# Patient Record
Sex: Male | Born: 1955 | Race: White | Hispanic: No | Marital: Single | Smoking: Never smoker
Health system: Southern US, Community
[De-identification: ages and names within clinical notes are randomized; demographics above are authoritative.]

## PROBLEM LIST (undated history)

## (undated) DIAGNOSIS — G8929 Other chronic pain: Secondary | ICD-10-CM

## (undated) DIAGNOSIS — M549 Dorsalgia, unspecified: Secondary | ICD-10-CM

## (undated) DIAGNOSIS — M719 Bursopathy, unspecified: Secondary | ICD-10-CM

## (undated) DIAGNOSIS — F419 Anxiety disorder, unspecified: Secondary | ICD-10-CM

## (undated) DIAGNOSIS — G20 Parkinson's disease: Secondary | ICD-10-CM

## (undated) DIAGNOSIS — E785 Hyperlipidemia, unspecified: Secondary | ICD-10-CM

## (undated) DIAGNOSIS — G20A1 Parkinson's disease without dyskinesia, without mention of fluctuations: Secondary | ICD-10-CM

## (undated) HISTORY — PX: APPENDECTOMY: SHX54

## (undated) HISTORY — PX: HERNIA REPAIR: SHX51

## (undated) HISTORY — DX: Hyperlipidemia, unspecified: E78.5

## (undated) HISTORY — DX: Bursopathy, unspecified: M71.9

## (undated) HISTORY — DX: Anxiety disorder, unspecified: F41.9

## (undated) HISTORY — PX: OTHER SURGICAL HISTORY: SHX169

## (undated) HISTORY — DX: Dorsalgia, unspecified: M54.9

## (undated) HISTORY — DX: Other chronic pain: G89.29

---

## 2014-08-20 ENCOUNTER — Other Ambulatory Visit: Payer: Self-pay | Admitting: Orthopedic Surgery

## 2014-08-20 DIAGNOSIS — M2042 Other hammer toe(s) (acquired), left foot: Secondary | ICD-10-CM

## 2014-08-27 ENCOUNTER — Other Ambulatory Visit: Payer: Self-pay | Admitting: Orthopedic Surgery

## 2014-08-27 ENCOUNTER — Ambulatory Visit
Admission: RE | Admit: 2014-08-27 | Discharge: 2014-08-27 | Disposition: A | Discharge status: Home / Self Care | Payer: 59 | Source: Ambulatory Visit | Attending: Orthopedic Surgery | Admitting: Orthopedic Surgery

## 2014-08-27 DIAGNOSIS — M25572 Pain in left ankle and joints of left foot: Secondary | ICD-10-CM

## 2014-08-27 DIAGNOSIS — M2042 Other hammer toe(s) (acquired), left foot: Secondary | ICD-10-CM

## 2014-11-11 DIAGNOSIS — M19172 Post-traumatic osteoarthritis, left ankle and foot: Secondary | ICD-10-CM | POA: Insufficient documentation

## 2014-12-07 DIAGNOSIS — K219 Gastro-esophageal reflux disease without esophagitis: Secondary | ICD-10-CM | POA: Insufficient documentation

## 2015-01-30 DIAGNOSIS — Z96662 Presence of left artificial ankle joint: Secondary | ICD-10-CM | POA: Insufficient documentation

## 2016-09-14 DIAGNOSIS — M7552 Bursitis of left shoulder: Secondary | ICD-10-CM | POA: Diagnosis not present

## 2016-09-14 DIAGNOSIS — G252 Other specified forms of tremor: Secondary | ICD-10-CM | POA: Diagnosis not present

## 2016-09-29 ENCOUNTER — Encounter: Payer: Self-pay | Admitting: *Deleted

## 2016-09-30 ENCOUNTER — Encounter: Payer: Self-pay | Admitting: Diagnostic Neuroimaging

## 2016-09-30 ENCOUNTER — Encounter (INDEPENDENT_AMBULATORY_CARE_PROVIDER_SITE_OTHER): Payer: Self-pay

## 2016-09-30 ENCOUNTER — Ambulatory Visit (INDEPENDENT_AMBULATORY_CARE_PROVIDER_SITE_OTHER): Payer: 59 | Admitting: Diagnostic Neuroimaging

## 2016-09-30 VITALS — BP 140/91 | HR 71 | Ht 70.0 in | Wt 237.4 lb

## 2016-09-30 DIAGNOSIS — R259 Unspecified abnormal involuntary movements: Secondary | ICD-10-CM

## 2016-09-30 DIAGNOSIS — G252 Other specified forms of tremor: Secondary | ICD-10-CM

## 2016-09-30 DIAGNOSIS — R430 Anosmia: Secondary | ICD-10-CM

## 2016-09-30 NOTE — Progress Notes (Signed)
[facility]  PATIENT: John Madden DOB: April 22, 1956  REFERRING CLINICIAN: Nyra Capes HISTORY FROM: patient  REASON FOR VISIT: new consult    HISTORICAL  CHIEF COMPLAINT:  Chief Complaint  Patient presents with  . NP Nyra Capes  . Fine Tremors    fine tremors R hand and arm (some in L hand).  L shoulder pain, R leg sciatica pain    HISTORY OF PRESENT ILLNESS:   61 year old male here for evaluation of tremor in right upper extremity. Also with left shoulder pain and right sciatic nerve pain and numbness.  Patient reports at least 2-3 months of tremor while using his utensils or holding a cup. Sometimes his arm shakes without any action. Patient also has had one year of left shoulder pain treated conservatively. He has also had decreased sense of smell and taste in the last 1 year.  Patient denies any gait or balance difficulty. He has some joint swelling and pain. He has had some weight gain related to medications. Also with some blurred vision.  No other specific triggering or alleviating factors.  Patient reports history of "TIA" symptoms 2017, with workup which apparently was negative.    REVIEW OF SYSTEMS: Full 14 system review of systems performed and negative with exception of: Only as per history of present illness.  ALLERGIES: No Known Allergies  HOME MEDICATIONS: Outpatient Medications Prior to Visit  Medication Sig Dispense Refill  . aspirin EC 81 MG tablet Take 81 mg by mouth daily.    . Naproxen-Esomeprazole (VIMOVO PO) Take by mouth. One tab po bid     No facility-administered medications prior to visit.     PAST MEDICAL HISTORY: Past Medical History:  Diagnosis Date  . Anxiety   . Back pain, chronic   . Bursitis    left shoulder  . Hyperlipidemia     PAST SURGICAL HISTORY: Past Surgical History:  Procedure Laterality Date  . ankle surgery     left ankle  . APPENDECTOMY    . HERNIA REPAIR      FAMILY HISTORY: Family  History  Problem Relation Age of Onset  . Lymphoma Mother     2016 remission  . Lung cancer Father     SOCIAL HISTORY:  Social History   Social History  . Marital status: Single    Spouse name: N/A  . Number of children: N/A  . Years of education: N/A   Occupational History  . Not on file.   Social History Main Topics  . Smoking status: Never Smoker  . Smokeless tobacco: Never Used  . Alcohol use Yes     Comment: moderate  . Drug use: No  . Sexual activity: Not on file   Other Topics Concern  . Not on file   Social History Narrative   Lives home alone.  One grown child lives with mother in [place].  Works at [employer].  HS grad.  Caffeine one cup daily.      PHYSICAL EXAM  GENERAL EXAM/CONSTITUTIONAL: Vitals:  Vitals:   09/30/16 0812  BP: (!) 140/91  Pulse: 71  Weight: 237 lb 6.4 oz (107.7 kg)  Height: 5\' 10"  (1.778 m)     Body mass index is 34.06 kg/m.  Visual Acuity Screening   Right eye Left eye Both eyes  Without correction: 20/40 20/30   With correction:        Patient is in no distress; well developed, nourished and groomed; neck is supple  CARDIOVASCULAR:  Examination of carotid  arteries is normal; no carotid bruits  Regular rate and rhythm, no murmurs  Examination of peripheral vascular system by observation and palpation is normal  EYES:  Ophthalmoscopic exam of optic discs and posterior segments is normal; no papilledema or hemorrhages  MUSCULOSKELETAL:  Gait, strength, tone, movements noted in Neurologic exam below  NEUROLOGIC: MENTAL STATUS:  No flowsheet data found.  awake, alert, oriented to person, place and time  recent and remote memory intact  normal attention and concentration  language fluent, comprehension intact, naming intact  fund of knowledge appropriate  CRANIAL NERVE:   2nd - no papilledema on fundoscopic exam  2nd, 3rd, 4th, 6th - pupils equal and reactive to light, visual fields full to  confrontation, extraocular muscles intact, no nystagmus  5th - facial sensation symmetric  7th - facial strength symmetric  8th - hearing intact  9th - palate elevates symmetrically, uvula midline  11th - shoulder shrug symmetric  12th - tongue protrusion midline  SOFT VOICE; SLIGHTLY HOARSE  MOTOR:   SLIGHT INCR TONE IN LEFT UPPER EXT (COGWHEELING)  SLIGHT RESTING TREMOR IN RUE  MINOR POSTURAL TREMOR (RUE > LUE)  normal bulk; full strength in the BUE, BLE  NO BRADYKINESIA  SENSORY:   normal and symmetric to light touch, temperature, vibration  EXCEPT DECR SENS IN LEFT FOOT TO VIB  COORDINATION:   finger-nose-finger, fine finger movements normal  ABLE TO DRAW ARCHIMEDES SPIRAL NORMALLY  HANDWRITING SAMPLE NORMAL  REFLEXES:   deep tendon reflexes TRACE and symmetric  GAIT/STATION:   narrow based gait; DECR RIGHT ARM SWING; SUBTLE TREMOR IN RIGHT HAND WITH WALKING; romberg is negative    DIAGNOSTIC DATA (LABS, IMAGING, TESTING) - I reviewed patient records, labs, notes, testing and imaging myself where available.  No results found for: WBC, HGB, HCT, MCV, PLT No results found for: NA, K, CL, CO2, GLUCOSE, BUN, CREATININE, CALCIUM, PROT, ALBUMIN, AST, ALT, ALKPHOS, BILITOT, GFRNONAA, GFRAA No results found for: CHOL, HDL, LDLCALC, LDLDIRECT, TRIG, CHOLHDL No results found for: HGBA1C No results found for: VITAMINB12 No results found for: TSH       ASSESSMENT AND PLAN  61 y.o. year old male here with gradual onset progressive resting tremor, bradykinesia, cogwheel rigidity, decreased smell, consistent with parkinsonism. Will proceed with further workup.   Ddx tremor --> parkinsonism (parkinson's disease vs vascular parkinsonism) vs essential tremor vs enhanced physiologic tremor  1. Resting tremor   2. Postural tremor   3. Loss of smell      PLAN: - check MRI brain and lab testing; then may check DATscan - improve nutrition, exercise and  sleep - consider starting multivitamin +/- coenzyme Q10 - may consider carbidopa/levodopa at next visit  Orders Placed This Encounter  Procedures  . MR BRAIN WO CONTRAST  . TSH  . Vitamin B12   Return in about 3 months (around 12/30/2016).    Penni Bombard, MD 6/76/1950, 9:32 AM Certified in Neurology, Neurophysiology and Neuroimaging  [facility] [address], [place], [place] [address] [phone]

## 2016-09-30 NOTE — Patient Instructions (Signed)
Thank you for coming to see [place] at [facility]. I hope we have been able to provide you high quality care today.  You may receive a patient satisfaction survey over the next few weeks. We would appreciate your feedback and comments so that we may continue to improve ourselves and the health of our patients.   - check MRI brain and lab testing; then may check DATscan  - improve nutrition, exercise and sleep  - consider starting multivitamin +/- coenzyme Q10   ~~~~~~~~~~~~~~~~~~~~~~~~~~~~~~~~~~~~~~~~~~~~~~~~~~~~~~~~~~~~~~~~~  DR. PENUMALLI'S GUIDE TO HAPPY AND HEALTHY LIVING These are some of my general health and wellness recommendations. Some of them may apply to you better than others. Please use common sense as you try these suggestions and feel free to ask me any questions.   ACTIVITY/FITNESS Mental, social, emotional and physical stimulation are very important for brain and body health. Try learning a new activity (arts, music, language, sports, games).  Keep moving your body to the best of your abilities. You can do this at home, inside or outside, the park, community center, gym or anywhere you like. Consider a physical therapist or personal trainer to get started. Consider the app Sworkit. Fitness trackers such as smart-watches, smart-phones or Fitbits can help as well.   NUTRITION Eat more plants: colorful vegetables, nuts, seeds and berries.  Eat less sugar, salt, preservatives and processed foods.  Avoid toxins such as cigarettes and alcohol.  Drink water when you are thirsty. Warm water with a slice of lemon is an excellent morning drink to start the day.  Consider these websites for more information The Nutrition Source (https://www.henry-hernandez.biz/) Precision Nutrition (WindowBlog.ch)   RELAXATION Consider practicing mindfulness meditation or other relaxation techniques such as deep breathing, prayer,  yoga, tai chi, massage. See website mindful.org or the apps Headspace or Calm to help get started.   SLEEP Try to get at least 7-8+ hours sleep per day. Regular exercise and reduced caffeine will help you sleep better. Practice good sleep hygeine techniques. See website sleep.org for more information.   PLANNING Prepare estate planning, living will, healthcare POA documents. Sometimes this is best planned with the help of an attorney. Theconversationproject.org and agingwithdignity.org are excellent resources.

## 2016-10-01 ENCOUNTER — Telehealth: Payer: Self-pay | Admitting: *Deleted

## 2016-10-01 LAB — TSH: TSH: 1.03 u[IU]/mL (ref 0.450–4.500)

## 2016-10-01 LAB — VITAMIN B12: Vitamin B-12: 527 pg/mL (ref 232–1245)

## 2016-10-01 NOTE — Telephone Encounter (Signed)
Spoke to pt and relayed that lab results were normal.  He verbalized understanding.

## 2016-10-01 NOTE — Telephone Encounter (Signed)
-----   Message from Penni Bombard, MD sent at 10/01/2016  1:51 PM EDT ----- Unremarkable labs. Continue current plan. Please call patient. -VRP

## 2016-10-21 ENCOUNTER — Ambulatory Visit (INDEPENDENT_AMBULATORY_CARE_PROVIDER_SITE_OTHER): Payer: 59

## 2016-10-21 DIAGNOSIS — R430 Anosmia: Secondary | ICD-10-CM | POA: Diagnosis not present

## 2016-10-21 DIAGNOSIS — G252 Other specified forms of tremor: Secondary | ICD-10-CM | POA: Diagnosis not present

## 2016-10-21 DIAGNOSIS — R259 Unspecified abnormal involuntary movements: Secondary | ICD-10-CM | POA: Diagnosis not present

## 2016-10-27 ENCOUNTER — Telehealth: Payer: Self-pay | Admitting: *Deleted

## 2016-10-27 NOTE — Telephone Encounter (Signed)
Per Dr Leta Baptist, spoke with patient and informed him his MRI brain results are unremarkable, with mild spots of scar tissue. Advised Dr Leta Baptist will continue with current treatment plan. Reviewed Dr Jearld Lesch plan per office note, reminded pt of FU in July. He has started taking MVI, Co-Q 10.  Advised he monitor his symptoms, call prior to FU with any concerns, questions. He verbalized understanding, appreciation.

## 2016-12-29 DIAGNOSIS — M19172 Post-traumatic osteoarthritis, left ankle and foot: Secondary | ICD-10-CM | POA: Diagnosis not present

## 2016-12-29 DIAGNOSIS — Z96662 Presence of left artificial ankle joint: Secondary | ICD-10-CM | POA: Diagnosis not present

## 2016-12-29 DIAGNOSIS — Z471 Aftercare following joint replacement surgery: Secondary | ICD-10-CM | POA: Diagnosis not present

## 2017-01-11 ENCOUNTER — Ambulatory Visit (INDEPENDENT_AMBULATORY_CARE_PROVIDER_SITE_OTHER): Payer: 59 | Admitting: Diagnostic Neuroimaging

## 2017-01-11 ENCOUNTER — Encounter: Payer: Self-pay | Admitting: Diagnostic Neuroimaging

## 2017-01-11 VITALS — BP 128/90 | HR 63 | Ht 70.0 in | Wt 235.8 lb

## 2017-01-11 DIAGNOSIS — G252 Other specified forms of tremor: Secondary | ICD-10-CM

## 2017-01-11 NOTE — Progress Notes (Signed)
[facility]  PATIENT: John Madden DOB: January 02, 1956  REFERRING CLINICIAN: Nyra Capes HISTORY FROM: patient  REASON FOR VISIT: follow up    HISTORICAL  CHIEF COMPLAINT:  Chief Complaint  Patient presents with  . Follow-up  . Tremors    seem to be doing better.     HISTORY OF PRESENT ILLNESS:   UPDATE 01/11/17: Since last visit, now on co-Q10, exercising more, and feeling better. Has cut out coffee as well. Tremor is improved. Balance and walking better. Smell is slightly better.   PRIOR HPI (09/30/16): 61 year old male here for evaluation of tremor in right upper extremity. Also with left shoulder pain and right sciatic nerve pain and numbness. Patient reports at least 2-3 months of tremor while using his utensils or holding a cup. Sometimes his arm shakes without any action. Patient also has had one year of left shoulder pain treated conservatively. He has also had decreased sense of smell and taste in the last 1 year. Patient denies any gait or balance difficulty. He has some joint swelling and pain. He has had some weight gain related to medications. Also with some blurred vision. No other specific triggering or alleviating factors.  Patient reports history of "TIA" symptoms 2017, with workup which apparently was negative.   REVIEW OF SYSTEMS: Full 14 system review of systems performed and negative with exception of: tremors.  ALLERGIES: No Known Allergies  HOME MEDICATIONS: Outpatient Medications Prior to Visit  Medication Sig Dispense Refill  . co-enzyme Q-10 30 MG capsule Take 30 mg by mouth 3 (three) times daily.    . Multiple Vitamin (MULTIVITAMIN) tablet Take 1 tablet by mouth daily.    . Naproxen-Esomeprazole (VIMOVO) 500-20 MG TBEC Take 1 tablet by mouth 2 (two) times daily.     No facility-administered medications prior to visit.     PAST MEDICAL HISTORY: Past Medical History:  Diagnosis Date  . Anxiety   . Back pain, chronic   . Bursitis      left shoulder  . Hyperlipidemia     PAST SURGICAL HISTORY: Past Surgical History:  Procedure Laterality Date  . ankle surgery     left ankle  . APPENDECTOMY    . HERNIA REPAIR      FAMILY HISTORY: Family History  Problem Relation Age of Onset  . Lymphoma Mother        2016 remission  . Lung cancer Father     SOCIAL HISTORY:  Social History   Social History  . Marital status: Single    Spouse name: N/A  . Number of children: N/A  . Years of education: N/A   Occupational History  . Not on file.   Social History Main Topics  . Smoking status: Never Smoker  . Smokeless tobacco: Never Used  . Alcohol use Yes     Comment: moderate  . Drug use: No  . Sexual activity: Not on file   Other Topics Concern  . Not on file   Social History Narrative   Lives home alone.  One grown child lives with mother in [place].  Works at [employer].  HS grad.  Caffeine one cup daily.      PHYSICAL EXAM  GENERAL EXAM/CONSTITUTIONAL: Vitals:  Vitals:   01/11/17 1339  BP: 128/90  Pulse: 63  Weight: 235 lb 12.8 oz (107 kg)  Height: 5\' 10"  (1.778 m)   Body mass index is 33.83 kg/m. No exam data present  Patient is in no distress; well developed, nourished and  groomed; neck is supple  CARDIOVASCULAR:  Examination of carotid arteries is normal; no carotid bruits  Regular rate and rhythm, no murmurs  Examination of peripheral vascular system by observation and palpation is normal  EYES:  Ophthalmoscopic exam of optic discs and posterior segments is normal; no papilledema or hemorrhages  MUSCULOSKELETAL:  Gait, strength, tone, movements noted in Neurologic exam below  NEUROLOGIC: MENTAL STATUS:  No flowsheet data found.  awake, alert, oriented to person, place and time  recent and remote memory intact  normal attention and concentration  language fluent, comprehension intact, naming intact  fund of knowledge appropriate  CRANIAL NERVE:   2nd - no  papilledema on fundoscopic exam  2nd, 3rd, 4th, 6th - pupils equal and reactive to light, visual fields full to confrontation, extraocular muscles intact, no nystagmus  5th - facial sensation symmetric  7th - facial strength symmetric  8th - hearing intact  9th - palate elevates symmetrically, uvula midline  11th - shoulder shrug symmetric  12th - tongue protrusion midline  SOFT VOICE; SLIGHTLY HOARSE  MOTOR:   SUBTLE INCR TONE IN LEFT UPPER EXT (COGWHEELING)  NO RESTING TREMOR  MINOR POSTURAL TREMOR (RUE > LUE)  NO BRADYKINESIA IN BUE  LEFT ANKLE / FOOT TAP LIMITED FROM PRIOR INJURY  normal bulk; full strength in the BUE, BLE  SENSORY:   normal and symmetric to light touch, temperature, vibration  EXCEPT DECR SENS IN LEFT FOOT TO VIB  COORDINATION:   finger-nose-finger, fine finger movements normal  REFLEXES:   deep tendon reflexes TRACE and symmetric  GAIT/STATION:   narrow based gait; SLIGHT DECR RIGHT ARM SWING    DIAGNOSTIC DATA (LABS, IMAGING, TESTING) - I reviewed patient records, labs, notes, testing and imaging myself where available.  No results found for: WBC, HGB, HCT, MCV, PLT No results found for: NA, K, CL, CO2, GLUCOSE, BUN, CREATININE, CALCIUM, PROT, ALBUMIN, AST, ALT, ALKPHOS, BILITOT, GFRNONAA, GFRAA No results found for: CHOL, HDL, LDLCALC, LDLDIRECT, TRIG, CHOLHDL No results found for: HGBA1C Lab Results  Component Value Date   VITAMINB12 527 09/30/2016   Lab Results  Component Value Date   TSH 1.030 09/30/2016     10/21/16 MRI brain - Abnormal MRI scan of the brain showing scattered tiny periventricular and subcortical nonspecific white matter hyperintensities with the differential discussed above.     ASSESSMENT AND PLAN  61 y.o. year old male here with gradual onset (~2017) progressive resting tremor, bradykinesia, cogwheel rigidity, decreased smell, possibly consistent with parkinsonism.    Ddx tremor -->  parkinsonism (parkinson's disease vs vascular parkinsonism) vs essential tremor vs enhanced physiologic tremor  1. Postural tremor      PLAN:  - continue to optimize nutrition, exercise and sleep - continue multivitamin + coenzyme Q10 - may consider carbidopa/levodopa at next visit  Return in about 6 months (around 07/14/2017).    Penni Bombard, MD 0/27/7412, 8:78 PM Certified in Neurology, Neurophysiology and Neuroimaging  [facility] [address] [place], [place] [address] [phone]

## 2017-03-09 DIAGNOSIS — C4442 Squamous cell carcinoma of skin of scalp and neck: Secondary | ICD-10-CM | POA: Diagnosis not present

## 2017-03-09 DIAGNOSIS — L821 Other seborrheic keratosis: Secondary | ICD-10-CM | POA: Diagnosis not present

## 2017-05-26 DIAGNOSIS — K5904 Chronic idiopathic constipation: Secondary | ICD-10-CM | POA: Diagnosis not present

## 2017-05-26 DIAGNOSIS — G20 Parkinson's disease: Secondary | ICD-10-CM | POA: Diagnosis not present

## 2017-06-03 DIAGNOSIS — H2513 Age-related nuclear cataract, bilateral: Secondary | ICD-10-CM | POA: Diagnosis not present

## 2017-06-03 DIAGNOSIS — H43393 Other vitreous opacities, bilateral: Secondary | ICD-10-CM | POA: Diagnosis not present

## 2017-07-14 ENCOUNTER — Ambulatory Visit: Payer: 59 | Admitting: Diagnostic Neuroimaging

## 2017-08-07 ENCOUNTER — Encounter: Payer: Self-pay | Admitting: Diagnostic Neuroimaging

## 2017-08-11 ENCOUNTER — Ambulatory Visit: Payer: 59 | Admitting: Diagnostic Neuroimaging

## 2017-09-07 DIAGNOSIS — M25512 Pain in left shoulder: Secondary | ICD-10-CM | POA: Diagnosis not present

## 2017-09-07 DIAGNOSIS — M7522 Bicipital tendinitis, left shoulder: Secondary | ICD-10-CM | POA: Diagnosis not present

## 2017-09-07 DIAGNOSIS — M25712 Osteophyte, left shoulder: Secondary | ICD-10-CM | POA: Diagnosis not present

## 2017-09-07 DIAGNOSIS — M19012 Primary osteoarthritis, left shoulder: Secondary | ICD-10-CM | POA: Diagnosis not present

## 2017-09-07 DIAGNOSIS — M7552 Bursitis of left shoulder: Secondary | ICD-10-CM | POA: Diagnosis not present

## 2017-11-25 DIAGNOSIS — E785 Hyperlipidemia, unspecified: Secondary | ICD-10-CM | POA: Diagnosis not present

## 2017-11-25 DIAGNOSIS — L814 Other melanin hyperpigmentation: Secondary | ICD-10-CM | POA: Diagnosis not present

## 2017-11-25 DIAGNOSIS — K5904 Chronic idiopathic constipation: Secondary | ICD-10-CM | POA: Diagnosis not present

## 2017-11-25 DIAGNOSIS — L821 Other seborrheic keratosis: Secondary | ICD-10-CM | POA: Diagnosis not present

## 2017-11-25 DIAGNOSIS — G20 Parkinson's disease: Secondary | ICD-10-CM | POA: Diagnosis not present

## 2017-11-26 DIAGNOSIS — Z13220 Encounter for screening for lipoid disorders: Secondary | ICD-10-CM | POA: Diagnosis not present

## 2017-11-26 DIAGNOSIS — Z1329 Encounter for screening for other suspected endocrine disorder: Secondary | ICD-10-CM | POA: Diagnosis not present

## 2017-12-07 DIAGNOSIS — E785 Hyperlipidemia, unspecified: Secondary | ICD-10-CM | POA: Diagnosis not present

## 2018-01-04 DIAGNOSIS — M19172 Post-traumatic osteoarthritis, left ankle and foot: Secondary | ICD-10-CM | POA: Diagnosis not present

## 2018-01-04 DIAGNOSIS — Z96662 Presence of left artificial ankle joint: Secondary | ICD-10-CM | POA: Diagnosis not present

## 2018-03-21 DIAGNOSIS — K5900 Constipation, unspecified: Secondary | ICD-10-CM | POA: Diagnosis not present

## 2018-03-23 DIAGNOSIS — M25519 Pain in unspecified shoulder: Secondary | ICD-10-CM | POA: Diagnosis not present

## 2018-03-23 DIAGNOSIS — M25512 Pain in left shoulder: Secondary | ICD-10-CM | POA: Diagnosis not present

## 2018-05-26 DIAGNOSIS — E785 Hyperlipidemia, unspecified: Secondary | ICD-10-CM | POA: Diagnosis not present

## 2018-06-02 DIAGNOSIS — E785 Hyperlipidemia, unspecified: Secondary | ICD-10-CM | POA: Diagnosis not present

## 2018-06-02 DIAGNOSIS — G20 Parkinson's disease: Secondary | ICD-10-CM | POA: Diagnosis not present

## 2018-06-02 DIAGNOSIS — R5383 Other fatigue: Secondary | ICD-10-CM | POA: Diagnosis not present

## 2018-06-02 DIAGNOSIS — K5904 Chronic idiopathic constipation: Secondary | ICD-10-CM | POA: Diagnosis not present

## 2018-06-02 DIAGNOSIS — G252 Other specified forms of tremor: Secondary | ICD-10-CM | POA: Diagnosis not present

## 2018-07-18 DIAGNOSIS — H2513 Age-related nuclear cataract, bilateral: Secondary | ICD-10-CM | POA: Diagnosis not present

## 2018-08-26 ENCOUNTER — Emergency Department (HOSPITAL_COMMUNITY)
Admission: EM | Admit: 2018-08-26 | Discharge: 2018-08-26 | Disposition: A | Discharge status: Home / Self Care | Payer: No Typology Code available for payment source | Attending: Emergency Medicine | Admitting: Emergency Medicine

## 2018-08-26 ENCOUNTER — Other Ambulatory Visit: Payer: Self-pay

## 2018-08-26 ENCOUNTER — Encounter (HOSPITAL_COMMUNITY): Payer: Self-pay | Admitting: Internal Medicine

## 2018-08-26 ENCOUNTER — Emergency Department (HOSPITAL_COMMUNITY): Payer: No Typology Code available for payment source

## 2018-08-26 DIAGNOSIS — Y939 Activity, unspecified: Secondary | ICD-10-CM | POA: Insufficient documentation

## 2018-08-26 DIAGNOSIS — Y929 Unspecified place or not applicable: Secondary | ICD-10-CM | POA: Diagnosis not present

## 2018-08-26 DIAGNOSIS — Z79899 Other long term (current) drug therapy: Secondary | ICD-10-CM | POA: Insufficient documentation

## 2018-08-26 DIAGNOSIS — S63502A Unspecified sprain of left wrist, initial encounter: Secondary | ICD-10-CM | POA: Insufficient documentation

## 2018-08-26 DIAGNOSIS — Y990 Civilian activity done for income or pay: Secondary | ICD-10-CM | POA: Insufficient documentation

## 2018-08-26 DIAGNOSIS — X500XXA Overexertion from strenuous movement or load, initial encounter: Secondary | ICD-10-CM | POA: Diagnosis not present

## 2018-08-26 DIAGNOSIS — Z7982 Long term (current) use of aspirin: Secondary | ICD-10-CM | POA: Insufficient documentation

## 2018-08-26 NOTE — ED Provider Notes (Signed)
[facility] EMERGENCY DEPARTMENT Provider Note   CSN: [id number] Arrival date & time: 08/26/18  0845    History   Chief Complaint Chief Complaint  Patient presents with  . Wrist Pain    HPI John Madden is a 63 y.o. male presenting today for left wrist pain.  Patient states that he was at work yesterday afternoon moving inventory around when he noticed a mild pain to his left ulnar wrist.  Patient states that pain has gradually worsened since time of onset he currently endorses that 8/10 in severity sharp pain that is worsened with pronation and supination of the wrist.  Patient states that his pain is nearly fully improved with rest.  Patient denies fall or trauma of the wrist.  He states that he was simply lifting boxes and crates.  He denies any other pain or injury today.  He states that he is overall feeling well, denies recent fever or illness or any other concerns.  Of note patient states that he has been recently diagnosed with "early Parkinson's" for which he is taking an unknown medication.     HPI  Past Medical History:  Diagnosis Date  . Anxiety   . Back pain, chronic   . Bursitis    left shoulder  . Hyperlipidemia     There are no active problems to display for this patient.   Past Surgical History:  Procedure Laterality Date  . ankle surgery     left ankle  . APPENDECTOMY    . HERNIA REPAIR          Home Medications    Prior to Admission medications   Medication Sig Start Date End Date Taking? Authorizing Provider  aspirin 81 MG chewable tablet Chew by mouth daily.    [provider]  co-enzyme Q-10 30 MG capsule Take 30 mg by mouth 3 (three) times daily.    [provider]  Multiple Vitamin (MULTIVITAMIN) tablet Take 1 tablet by mouth daily.    [provider]    Family History Family History  Problem Relation Age of Onset  . Lymphoma Mother        2016 remission  . Lung cancer Father      Social History Social History   Tobacco Use  . Smoking status: Never Smoker  . Smokeless tobacco: Never Used  Substance Use Topics  . Alcohol use: Yes    Comment: moderate  . Drug use: No     Allergies   Patient has no known allergies.   Review of Systems Review of Systems  Constitutional: Negative.  Negative for chills and fatigue.  Musculoskeletal: Positive for arthralgias (Left wrist) and joint swelling (Left wrist, improved). Negative for back pain and neck pain.  Skin: Negative.  Negative for color change and wound.  Neurological: Negative.  Negative for weakness, numbness and headaches.   Physical Exam Updated Vital Signs BP (!) 140/96 (BP Location: Right Arm)   Pulse 70   Temp 98.2 F (36.8 C) (Oral)   Resp 18   SpO2 100%   Physical Exam Constitutional:      General: He is not in acute distress.    Appearance: Normal appearance. He is well-developed. He is not ill-appearing or diaphoretic.  HENT:     Head: Normocephalic and atraumatic.     Right Ear: External ear normal.     Left Ear: External ear normal.     Nose: Nose normal.  Eyes:     General:  Vision grossly intact. Gaze aligned appropriately.     Pupils: Pupils are equal, round, and reactive to light.  Neck:     Musculoskeletal: Normal range of motion.     Trachea: Trachea and phonation normal. No tracheal deviation.  Pulmonary:     Effort: Pulmonary effort is normal. No respiratory distress.  Abdominal:     General: There is no distension.     Palpations: Abdomen is soft.     Tenderness: There is no abdominal tenderness. There is no guarding or rebound.  Musculoskeletal: Normal range of motion.     Left elbow: Normal.     Left wrist: He exhibits tenderness. He exhibits normal range of motion, no swelling and no deformity.     Left upper arm: Normal.     Left forearm: Normal.       Arms:     Left hand: Normal.     Comments: Left hand: No gross deformities, skin intact. Fingers appear normal.   No swelling or erythema present.  Appropriate tactile temperature bilaterally. Patient reports his tenderness about the styloid process of the left ulna, minimally worsened with palpation.  Primarily worsened with pronation and supination of the wrist. No snuffbox tenderness to palpation. No tenderness to palpation over flexor sheath.  Finger adduction/abduction intact with 5/5 strength.  Thumb opposition intact. Full active and resisted ROM to flexion/extension at wrist, MCP, PIP and DIP of all fingers.  FDS/FDP intact. Grip 5/5 strength.  Radial artery 2+ with <2sec cap refill in all fingers.  Sensation intact to light-tough in median/ulnar/radial distributions.  Full range of motion and strength at the left elbow and left fingers.  Skin:    General: Skin is warm and dry.  Neurological:     Mental Status: He is alert.     GCS: GCS eye subscore is 4. GCS verbal subscore is 5. GCS motor subscore is 6.     Comments: Speech is clear and goal oriented, follows commands Major Cranial nerves without deficit, no facial droop Moves extremities without ataxia, coordination intact Normal gait  Patient with fine resting tremor, states is his normal.  Psychiatric:        Behavior: Behavior normal.      ED Treatments / Results  Labs (all labs ordered are listed, but only abnormal results are displayed) Labs Reviewed - No data to display  EKG None  Radiology Dg Wrist Complete Left  Result Date: 08/26/2018 CLINICAL DATA:  Left wrist pain.  No known injury. EXAM: LEFT WRIST - COMPLETE 3+ VIEW COMPARISON:  No recent prior. FINDINGS: Mild diffuse degenerative change. Tiny carpal cysts are noted, these are most likely degenerative. Crystal induced arthropathy can not be completely excluded. No acute bony or joint abnormality. No evidence of fracture or dislocation. IMPRESSION: Mild diffuse degenerative change. 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 These are most likely degenerative. Crystal induced  arthropathy can not be excluded. Electronically Signed   By: Marcello Moores  Register   On: 08/26/2018 09:25    Procedures Procedures (including critical care time)  Medications Ordered in ED Medications - No data to display   Initial Impression / Assessment and Plan / ED Course  I have reviewed the triage vital signs and the nursing notes.  Pertinent labs & imaging results that were available during my care of the patient were reviewed by me and considered in my medical decision making (see chart for details).    63 year old male presenting for left wrist pain after lifting at work yesterday.  No trauma or fall.  No other complaints.  Bilateral upper extremities neurovascularly intact; no signs of infection, septic joint, DVT, compartment syndrome. Patient with full ROM and 5/5 strength with all movements however with increased pain with ulnar deviation and supination and pronation of the left wrist.  Minimal pain with flexion and extension of the left wrist.  DG left wrist: IMPRESSION: Mild diffuse degenerative change. 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 These are most likely degenerative. Crystal induced arthropathy can not be excluded.  - Patient updated on findings today.  Orthopedic referral given.  Patient informed to call the office today to schedule an appointment for next week.  Low suspicion for crystal arthropathy at this time, does not appear as gout.  Suspect pain secondary to strain.  Velcro wrist brace has been provided.  At this time there does not appear to be any evidence of an acute emergency medical condition and the patient appears stable for discharge with appropriate outpatient follow up. Diagnosis was discussed with patient who verbalizes understanding of care plan and is agreeable to discharge. I have discussed return precautions with patient who verbalizes understanding of return precautions. Patient encouraged to follow-up with their PCP and ortho. All questions answered.   Patient's case discussed with Dr. Darl Householder who agrees with plan to discharge with follow-up.   Note: Portions of this report may have been transcribed using voice recognition software. Every effort was made to ensure accuracy; however, inadvertent computerized transcription errors may still be present. Final Clinical Impressions(s) / ED Diagnoses   Final diagnoses:  Sprain of left wrist, initial encounter    ED Discharge Orders    None       Gari Crown 08/26/18 1009    Drenda Freeze, MD 08/27/18 1505

## 2018-08-26 NOTE — ED Notes (Signed)
Patient transported to X-ray 

## 2018-08-26 NOTE — Discharge Instructions (Addendum)
You have been diagnosed today with Left Wrist Sprain.  At this time there does not appear to be the presence of an emergent medical condition, however there is always the potential for conditions to change. Please read and follow the below instructions.  Please return to the Emergency Department immediately for any new or worsening symptoms. Please be sure to follow up with your Primary Care Provider within one week regarding your visit today; please call their office to schedule an appointment even if you are feeling better for a follow-up visit. Please call the orthopedic specialist office, Dr. Apolonio Schneiders, today to schedule appointment for further evaluation of your left wrist for sometime next week.  You may use the wrist brace given you today to help protect your wrist.  You may use rest, ice and elevation to help with your pain. 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  Be sure to discuss this with your orthopedic doctor as well as your primary care doctor.  Get help right away if: You have a new or sudden sharp pain in the hand, arm, or wrist. You have tingling or numbness in your hand. Your fingers turn white, very red, or cold and blue. You cannot move your fingers. You have fever You have redness or swelling Any new or concerning symptoms  Please read the additional information packets attached to your discharge summary.

## 2018-08-26 NOTE — ED Triage Notes (Signed)
Pt here for evaluation of left wrist pain that started while he was moving some items around at work yesterday. Pain increased when he was at home. Currently 8/10 sharp pain when performing twisting motion with wrist. VSS at this time. A/Ox4.

## 2018-08-26 NOTE — Progress Notes (Signed)
Orthopedic Tech Progress Note Patient Details:  Kashon Kraynak Aug 04, 1955 [id number]  Ortho Devices Type of Ortho Device: Velcro wrist forearm splint Ortho Device/Splint Location: ULE Ortho Device/Splint Interventions: Adjustment, Application, Ordered   Post Interventions Patient Tolerated: Well Instructions Provided: Care of device, Adjustment of device   Janit Pagan 08/26/2018, 9:43 AM

## 2018-08-26 NOTE — ED Notes (Addendum)
Pt given work note

## 2018-08-26 NOTE — ED Notes (Signed)
Patient verbalizes understanding of discharge instructions. Opportunity for questioning and answers were provided. Armband removed by staff, pt discharged from ED.  

## 2018-09-14 DIAGNOSIS — Z7189 Other specified counseling: Secondary | ICD-10-CM | POA: Diagnosis not present

## 2018-09-14 DIAGNOSIS — R0989 Other specified symptoms and signs involving the circulatory and respiratory systems: Secondary | ICD-10-CM | POA: Diagnosis not present

## 2018-10-24 DIAGNOSIS — N529 Male erectile dysfunction, unspecified: Secondary | ICD-10-CM | POA: Diagnosis not present

## 2018-10-24 DIAGNOSIS — N401 Enlarged prostate with lower urinary tract symptoms: Secondary | ICD-10-CM | POA: Diagnosis not present

## 2019-12-19 ENCOUNTER — Ambulatory Visit: Payer: 59 | Admitting: Family

## 2020-01-01 ENCOUNTER — Encounter: Payer: Self-pay | Admitting: Family

## 2020-01-01 ENCOUNTER — Other Ambulatory Visit: Payer: Self-pay

## 2020-01-01 ENCOUNTER — Ambulatory Visit: Payer: 59 | Admitting: Family

## 2020-01-01 VITALS — BP 132/78 | HR 83 | Temp 98.1°F | Ht 70.0 in | Wt 233.0 lb

## 2020-01-01 DIAGNOSIS — Z125 Encounter for screening for malignant neoplasm of prostate: Secondary | ICD-10-CM

## 2020-01-01 DIAGNOSIS — Z1159 Encounter for screening for other viral diseases: Secondary | ICD-10-CM | POA: Diagnosis not present

## 2020-01-01 DIAGNOSIS — Z13220 Encounter for screening for lipoid disorders: Secondary | ICD-10-CM | POA: Diagnosis not present

## 2020-01-01 DIAGNOSIS — E559 Vitamin D deficiency, unspecified: Secondary | ICD-10-CM

## 2020-01-01 DIAGNOSIS — Z0000 Encounter for general adult medical examination without abnormal findings: Secondary | ICD-10-CM

## 2020-01-01 DIAGNOSIS — R5383 Other fatigue: Secondary | ICD-10-CM

## 2020-01-01 MED ORDER — KETOCONAZOLE 2 % EX SHAM
1.0000 | MEDICATED_SHAMPOO | CUTANEOUS | 6 refills | Status: DC
Start: 2020-01-01 — End: 2021-06-10

## 2020-01-01 NOTE — Progress Notes (Signed)
John Madden is a 64 y.o. male with the following history as recorded in EpicCare:  There are no problems to display for this patient.   Current Outpatient Medications  Medication Sig Dispense Refill  . Amantadine HCl 100 MG tablet Take by mouth.    Marland Kitchen aspirin 81 MG chewable tablet Chew by mouth daily.    . carbidopa-levodopa (SINEMET IR) 25-100 MG tablet Take 1 tablet by mouth 3 (three) times daily.    . Multiple Vitamin (MULTIVITAMIN) tablet Take 1 tablet by mouth daily.    . rasagiline (AZILECT) 1 MG TABS tablet Take 1 mg by mouth daily.    . tadalafil (CIALIS) 5 MG tablet Take by mouth.    . tamsulosin (FLOMAX) 0.4 MG CAPS capsule Take 0.4 mg by mouth every morning.    Marland Kitchen ketoconazole (NIZORAL) 2 % shampoo Apply 1 application topically 2 (two) times a week. 120 mL 6   No current facility-administered medications for this visit.    Allergies: Patient has no known allergies.  Past Medical History:  Diagnosis Date  . Anxiety   . Back pain, chronic   . Bursitis    left shoulder  . Hyperlipidemia     Past Surgical History:  Procedure Laterality Date  . ankle surgery     left ankle  . APPENDECTOMY    . HERNIA REPAIR      Family History  Problem Relation Age of Onset  . Lymphoma Mother        2016 remission  . Lung cancer Father     Social History   Tobacco Use  . Smoking status: Never Smoker  . Smokeless tobacco: Never Used  Substance Use Topics  . Alcohol use: Yes    Comment: moderate    Subjective:  Presents today as a new patient; transferring care from PCP in [place]; Under care of neurology at [facility] for Parkinson's Disease; Under care of urologist in [place];  Would like to get Tdap updated; Up to date on eye exam [facility]) and dentist;  S/p left ankle replacement 2018- Dr. Andree Elk at [facility];  Review of Systems  Constitutional: Negative.   HENT: Negative.   Eyes: Negative.   Respiratory: Negative.   Cardiovascular: Negative.   Gastrointestinal:  Negative.   Genitourinary: Negative.   Musculoskeletal: Negative.   Skin: Negative.   Neurological: Positive for tremors.       Parkinon's Disease  Endo/Heme/Allergies: Negative.   Psychiatric/Behavioral: Negative.          Objective:  Vitals:   01/01/20 0957  BP: 132/78  Pulse: 83  Temp: 98.1 F (36.7 C)  TempSrc: Oral  SpO2: 96%  Weight: 233 lb (105.7 kg)  Height: _0  (1.778 m)    General: Well developed, well nourished, in no acute distress  Skin : Warm and dry.  Head: Normocephalic and atraumatic  Eyes: Sclera and conjunctiva clear; pupils round and reactive to light; extraocular movements intact  Ears: External normal; canals clear; tympanic membranes normal  Oropharynx: Pink, supple. No suspicious lesions  Neck: Supple without thyromegaly, adenopathy  Lungs: Respirations unlabored; clear to auscultation bilaterally without wheeze, rales, rhonchi  CVS exam: normal rate and regular rhythm.  Abdomen: Soft; nontender; nondistended; normoactive bowel sounds; no masses or hepatosplenomegaly  Musculoskeletal: No deformities; no active joint inflammation  Extremities: No edema, cyanosis, clubbing  Vessels: Symmetric bilaterally  Neurologic: Alert and oriented; speech intact; face symmetrical; moves all extremities well; CNII-XII intact without focal deficit   Assessment:  1. PE (  physical exam), annual   2. Lipid screening   3. Other fatigue   4. Need for hepatitis C screening test   5. Vitamin D deficiency   6. Prostate cancer screening     Plan:  Age appropriate preventive healthcare needs addressed; encouraged regular eye doctor and dental exams; encouraged regular exercise; will update labs and refills as needed today; follow-up to be determined; Tdap updated;   This visit occurred during the SARS-CoV-2 public health emergency.  Safety protocols were in place, including screening questions prior to the visit, additional usage of staff PPE, and extensive  cleaning of exam room while observing appropriate contact time as indicated for disinfecting solutions.     No follow-ups on file.  Orders Placed This Encounter  Procedures  . CBC with Differential/Platelet    Standing Status:   Future    Standing Expiration Date:   12/31/2020  . Comp Met (CMET)    Standing Status:   Future    Standing Expiration Date:   12/31/2020  . Lipid panel    Standing Status:   Future    Standing Expiration Date:   12/31/2020  . TSH    Standing Status:   Future    Standing Expiration Date:   12/31/2020  . B12    Standing Status:   Future    Standing Expiration Date:   12/31/2020  . Hepatitis C Antibody    Standing Status:   Future    Standing Expiration Date:   12/31/2020  . Vitamin D (25 hydroxy)    Standing Status:   Future    Standing Expiration Date:   12/31/2020  . PSA    Standing Status:   Future    Standing Expiration Date:   12/31/2020    Requested Prescriptions   Signed Prescriptions Disp Refills  . ketoconazole (NIZORAL) 2 % shampoo 120 mL 6    Sig: Apply 1 application topically 2 (two) times a week.

## 2020-01-01 NOTE — Addendum Note (Signed)
Addended by: Cresenciano Lick on: 01/01/2020 10:45 AM   Modules accepted: Orders

## 2020-01-02 LAB — LIPID PANEL
Cholesterol: 178 mg/dL (ref <200)
HDL: 42 mg/dL (ref >=40)
LDL Cholesterol (Calc): 111 mg/dL (calc) — ABNORMAL HIGH
Non-HDL Cholesterol (Calc): 136 mg/dL (calc) — ABNORMAL HIGH (ref <130)
Total CHOL/HDL Ratio: 4.2 (calc) (ref <5.0)
Triglycerides: 133 mg/dL (ref <150)

## 2020-01-02 LAB — CBC WITH DIFFERENTIAL/PLATELET
Absolute Monocytes: 378 cells/uL (ref 200–950)
Basophils Absolute: 19 cells/uL (ref 0–200)
Basophils Relative: 0.3 %
Eosinophils Absolute: 166 cells/uL (ref 15–500)
Eosinophils Relative: 2.6 %
HCT: 50.1 % — ABNORMAL HIGH (ref 38.5–50.0)
Hemoglobin: 16.3 g/dL (ref 13.2–17.1)
Lymphs Abs: 1574 cells/uL (ref 850–3900)
MCH: 30.4 pg (ref 27.0–33.0)
MCHC: 32.5 g/dL (ref 32.0–36.0)
MCV: 93.5 fL (ref 80.0–100.0)
MPV: 10.6 fL (ref 7.5–12.5)
Monocytes Relative: 5.9 %
Neutro Abs: 4262 cells/uL (ref 1500–7800)
Neutrophils Relative %: 66.6 %
Platelets: 149 10*3/uL (ref 140–400)
RBC: 5.36 10*6/uL (ref 4.20–5.80)
RDW: 12.5 % (ref 11.0–15.0)
Total Lymphocyte: 24.6 %
WBC: 6.4 10*3/uL (ref 3.8–10.8)

## 2020-01-02 LAB — COMPREHENSIVE METABOLIC PANEL
AG Ratio: 1.8 (calc) (ref 1.0–2.5)
ALT: 15 U/L (ref 9–46)
AST: 13 U/L (ref 10–35)
Albumin: 4.4 g/dL (ref 3.6–5.1)
Alkaline phosphatase (APISO): 46 U/L (ref 35–144)
BUN: 19 mg/dL (ref 7–25)
CO2: 28 mmol/L (ref 20–32)
Calcium: 9.8 mg/dL (ref 8.6–10.3)
Chloride: 104 mmol/L (ref 98–110)
Creat: 1.17 mg/dL (ref 0.70–1.25)
Globulin: 2.4 g/dL (calc) (ref 1.9–3.7)
Glucose, Bld: 97 mg/dL (ref 65–99)
Potassium: 4.2 mmol/L (ref 3.5–5.3)
Sodium: 141 mmol/L (ref 135–146)
Total Bilirubin: 0.6 mg/dL (ref 0.2–1.2)
Total Protein: 6.8 g/dL (ref 6.1–8.1)

## 2020-01-02 LAB — HEPATITIS C ANTIBODY
Hepatitis C Ab: NONREACTIVE
SIGNAL TO CUT-OFF: 0.02 (ref <1.00)

## 2020-01-02 LAB — PSA: PSA: 0.7 ng/mL (ref <=4.0)

## 2020-01-02 LAB — TSH: TSH: 0.9 mIU/L (ref 0.40–4.50)

## 2020-01-02 LAB — VITAMIN B12: Vitamin B-12: 764 pg/mL (ref 200–1100)

## 2020-01-02 LAB — VITAMIN D 25 HYDROXY (VIT D DEFICIENCY, FRACTURES): Vit D, 25-Hydroxy: 35 ng/mL (ref 30–100)

## 2020-01-24 ENCOUNTER — Encounter: Payer: Self-pay | Admitting: Family

## 2020-01-25 ENCOUNTER — Other Ambulatory Visit: Payer: Self-pay

## 2020-01-25 ENCOUNTER — Encounter: Payer: Self-pay | Admitting: Internal Medicine

## 2020-01-25 ENCOUNTER — Ambulatory Visit: Payer: 59 | Admitting: Internal Medicine

## 2020-01-25 VITALS — BP 120/86 | HR 75 | Temp 98.6°F | Ht 70.0 in | Wt 234.0 lb

## 2020-01-25 DIAGNOSIS — H0102A Squamous blepharitis right eye, upper and lower eyelids: Secondary | ICD-10-CM | POA: Diagnosis not present

## 2020-01-25 MED ORDER — BLEPHAMIDE S.O.P. 10-0.2 % OP OINT: 1.0000 "application " | TOPICAL_OINTMENT | Freq: Four times a day (QID) | OPHTHALMIC | 0 refills | Status: DC

## 2020-01-25 NOTE — Patient Instructions (Signed)
Blepharitis °Blepharitis is inflammation of the eyelids. Blepharitis may happen with: °· Reddish, scaly skin around the scalp and eyebrows. °· Burning or itching of the eyelids. °· Eye discharge at night that causes the eyelashes to stick together in the morning. °· Eyelashes that fall out. °· Sensitivity to light. °Follow these instructions at home: °Pay attention to any changes in how your eyes look or feel. Tell your health care provider about any changes. Follow these instructions to help with your condition. °Keeping Clean ° °· Wash your hands often. °· Wash your eyelids with warm water or with warm water that is mixed with a small amount of baby shampoo. Do this two times per day or as often as needed. °· Wash your face and eyebrows at least once a day. °· Use a clean towel each time you dry your eyelids. Do not use this towel to clean or dry other areas of your body. Do not share your towel with anyone. °General instructions °· Avoid wearing makeup until you get better. Do not share makeup with anyone. °· Avoid rubbing your eyes. °· Apply warm compresses to your eyes 2 times per day for 10 minutes at a time, or as told by your health care provider. °· If you were prescribed an antibiotic ointment or steroid drops, apply or use the medicine as told by your health care provider. Do not stop using the medicine even if you feel better. °· Keep all follow-up visits as told by your health care provider. This is important. °Contact a health care provider if: °· Your eyelids feel hot. °· You have blisters or a rash on your eyelids. °· The condition does not go away in 2-4 days. °· The inflammation gets worse. °Get help right away if: °· You have pain or redness that gets worse or spreads to other parts of your face. °· Your vision changes. °· You have pain when looking at lights or moving objects. °· You have a fever. °Summary °· Blepharitis is inflammation of the eyelids. °· Pay attention to any changes in how your  eyes look or feel. Tell your health care provider about any changes. °· Follow home care instructions as told by your health care provider. Wash your hands often. Avoid wearing makeup. Do not rub your eyes. °· To treat this condition, use warm compresses and prescription ointments or eye drops. °· Let your health care provider know if you have vision changes, blisters or rash on eyelids, pain that spreads to your face, or warmth on your eyelids. °This information is not intended to replace advice given to you by your health care provider. Make sure you discuss any questions you have with your health care provider. °Document Revised: 11/22/2017 Document Reviewed: 11/22/2017 °Elsevier Patient Education © 2020 [employer] ° °

## 2020-01-25 NOTE — Progress Notes (Signed)
Subjective:  Patient ID: John Madden, male    DOB: 09-Sep-1955  Age: 64 y.o. MRN: [id number]  CC: Facial Swelling  This visit occurred during the SARS-CoV-2 public health emergency.  Safety protocols were in place, including screening questions prior to the visit, additional usage of staff PPE, and extensive cleaning of exam room while observing appropriate contact time as indicated for disinfecting solutions.    HPI John Madden presents for concerns about his right eye.  He has a 3-day history of crusting and matting over his right upper and lower eyelids with a scant amount of yellow exudate and red/swollen eyelids.  The eye is not red or painful.  He has normal vision.  He denies photophobia.  Outpatient Medications Prior to Visit  Medication Sig Dispense Refill  . Amantadine HCl 100 MG tablet Take by mouth.    Marland Kitchen aspirin 81 MG chewable tablet Chew by mouth daily.    . carbidopa-levodopa (SINEMET IR) 25-100 MG tablet Take 1 tablet by mouth 3 (three) times daily.    Marland Kitchen ketoconazole (NIZORAL) 2 % shampoo Apply 1 application topically 2 (two) times a week. 120 mL 6  . Multiple Vitamin (MULTIVITAMIN) tablet Take 1 tablet by mouth daily.    . rasagiline (AZILECT) 1 MG TABS tablet Take 1 mg by mouth daily.    . tadalafil (CIALIS) 5 MG tablet Take by mouth.    . tamsulosin (FLOMAX) 0.4 MG CAPS capsule Take 0.4 mg by mouth every morning.     No facility-administered medications prior to visit.    ROS Review of Systems  Constitutional: Negative.  Negative for chills, fatigue and fever.  HENT: Negative.   Eyes: Positive for discharge and itching. Negative for photophobia, pain, redness and visual disturbance.  Respiratory: Negative for cough and chest tightness.   Cardiovascular: Negative for chest pain, palpitations and leg swelling.  Gastrointestinal: Negative for abdominal pain, constipation, diarrhea, nausea and vomiting.  Endocrine: Negative.   Genitourinary: Negative.     Musculoskeletal: Negative.  Negative for back pain and myalgias.  Skin: Negative for rash.  Neurological: Negative.   Hematological: Negative for adenopathy. Does not bruise/bleed easily.  Psychiatric/Behavioral: Negative.     Objective:  BP 120/86 (BP Location: Left Arm, Patient Position: Sitting, Cuff Size: Large)   Pulse 75   Temp 98.6 F (37 C) (Oral)   Ht 5\' 10"  (1.778 m)   Wt 234 lb (106.1 kg)   SpO2 96%   BMI 33.58 kg/m   BP Readings from Last 3 Encounters:  01/25/20 120/86  01/01/20 132/78  08/26/18 (!) 140/96    Wt Readings from Last 3 Encounters:  01/25/20 234 lb (106.1 kg)  01/01/20 233 lb (105.7 kg)  01/11/17 235 lb 12.8 oz (107 kg)    Physical Exam Vitals reviewed.  HENT:     Nose: Nose normal.     Mouth/Throat:     Mouth: Mucous membranes are moist.  Eyes:     General: No scleral icterus.       Right eye: No foreign body, discharge or hordeolum.        Left eye: No foreign body, discharge or hordeolum.     Extraocular Movements: Extraocular movements intact.     Conjunctiva/sclera:     Right eye: No chemosis, exudate or hemorrhage.    Left eye: Left conjunctiva is not injected. No chemosis, exudate or hemorrhage.    Pupils: Pupils are equal, round, and reactive to light.     Comments: The right  upper and lower eyelids are mildly, diffusely erythematous and swollen.  There are no styes.  There is no exudate.  The corneal and conjunctival surfaces are normal.  The anterior chamber is quiet.  Cardiovascular:     Rate and Rhythm: Normal rate and regular rhythm.  Pulmonary:     Breath sounds: No wheezing or rhonchi.  Abdominal:     General: Abdomen is flat.  Musculoskeletal:        General: Normal range of motion.     Cervical back: Neck supple.  Lymphadenopathy:     Cervical: No cervical adenopathy.  Skin:    General: Skin is warm and dry.  Neurological:     General: No focal deficit present.     Mental Status: He is alert.     Lab Results   Component Value Date   WBC 6.4 01/01/2020   HGB 16.3 01/01/2020   HCT 50.1 (H) 01/01/2020   PLT 149 01/01/2020   GLUCOSE 97 01/01/2020   CHOL 178 01/01/2020   TRIG 133 01/01/2020   HDL 42 01/01/2020   LDLCALC 111 (H) 01/01/2020   ALT 15 01/01/2020   AST 13 01/01/2020   NA 141 01/01/2020   K 4.2 01/01/2020   CL 104 01/01/2020   CREATININE 1.17 01/01/2020   BUN 19 01/01/2020   CO2 28 01/01/2020   TSH 0.90 01/01/2020   PSA 0.7 01/01/2020    DG Wrist Complete Left  Result Date: 08/26/2018 CLINICAL DATA:  Left wrist pain.  No known injury. EXAM: LEFT WRIST - COMPLETE 3+ VIEW COMPARISON:  No recent prior. FINDINGS: Mild diffuse degenerative change. Tiny carpal cysts are noted, these are most likely degenerative. Crystal induced arthropathy can not be completely excluded. No acute bony or joint abnormality. No evidence of fracture or dislocation. IMPRESSION: Mild diffuse degenerative change. 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 These are most likely degenerative. Crystal induced arthropathy can not be excluded. Electronically Signed   By: Marcello Moores  Register   On: 08/26/2018 09:25    Assessment & Plan:   John Madden was seen today for facial swelling.  Diagnoses and all orders for this visit:  Squamous blepharitis of both upper and lower eyelid of right eye -     sulfacetaminde-prednisoLONE (BLEPHAMIDE S.O.P.) ophthalmic ointment; Place 1 application into the right eye 4 (four) times daily.   I am having John Madden "John Madden" start on Blephamide S.O.P.. I am also having him maintain his multivitamin, aspirin, Amantadine HCl, carbidopa-levodopa, tadalafil, rasagiline, tamsulosin, and ketoconazole.  Meds ordered this encounter  Medications  . sulfacetaminde-prednisoLONE (BLEPHAMIDE S.O.P.) ophthalmic ointment    Sig: Place 1 application into the right eye 4 (four) times daily.    Dispense:  3.5 g    Refill:  0     Follow-up: Return in about 2 weeks (around 02/08/2020).  John Calico, MD

## 2020-01-26 ENCOUNTER — Telehealth: Payer: Self-pay | Admitting: Family

## 2020-01-26 ENCOUNTER — Other Ambulatory Visit: Payer: Self-pay | Admitting: Family

## 2020-01-26 DIAGNOSIS — H0102A Squamous blepharitis right eye, upper and lower eyelids: Secondary | ICD-10-CM

## 2020-01-26 MED ORDER — BLEPHAMIDE S.O.P. 10-0.2 % OP OINT: 1.0000 "application " | TOPICAL_OINTMENT | Freq: Four times a day (QID) | OPHTHALMIC | 0 refills | Status: DC

## 2020-01-26 NOTE — Telephone Encounter (Signed)
New message:   Pt is calling and states the pharmacy stated that they have discontinued this medication (sulfacetaminde-prednisoLONE (BLEPHAMIDE S.O.P.) ophthalmic ointment) and he needs something else called into the pharmacy. Please advise.

## 2020-01-26 NOTE — Telephone Encounter (Signed)
Sent to [facility] as requested;

## 2020-01-26 NOTE — Telephone Encounter (Signed)
New message    Can sulfacetaminde-prednisoLONE (BLEPHAMIDE S.O.P.) ophthalmic ointment be sent to another pharmacy   Ammie Ferrier

## 2020-01-28 ENCOUNTER — Encounter: Payer: Self-pay | Admitting: Family

## 2020-01-29 ENCOUNTER — Other Ambulatory Visit: Payer: Self-pay | Admitting: Internal Medicine

## 2020-01-29 DIAGNOSIS — H0102A Squamous blepharitis right eye, upper and lower eyelids: Secondary | ICD-10-CM

## 2020-01-29 MED ORDER — SULFACETAMIDE-PREDNISOLONE 10-0.23 % OP SOLN: 2.0000 [drp] | OPHTHALMIC | 1 refills | Status: DC

## 2020-01-29 NOTE — Telephone Encounter (Signed)
Please discuss with Dr. Ronnald Ramp since I didn't see the patient.

## 2020-01-29 NOTE — Telephone Encounter (Signed)
The rx for blepharitis is on back order at both pharmacies.   Can an alternative be sent in?

## 2020-01-29 NOTE — Telephone Encounter (Signed)
The patients fiance called and said that the medication for sulfacetaminde-prednisoLONE (BLEPHAMIDE S.O.P.) ophthalmic ointment  And both pharmacies that it was on back order. She was wondering if there was anyway that something else could be called in.   John Madden on [place].

## 2020-01-31 ENCOUNTER — Encounter: Payer: Self-pay | Admitting: Family

## 2020-01-31 DIAGNOSIS — H0102A Squamous blepharitis right eye, upper and lower eyelids: Secondary | ICD-10-CM

## 2020-02-01 MED ORDER — SULFACETAMIDE SODIUM 10 % OP SOLN: 2.0000 [drp] | OPHTHALMIC | 0 refills | Status: DC

## 2020-02-01 MED ORDER — PREDNISOLONE ACETATE 0.12 % OP SUSP: 1.0000 [drp] | OPHTHALMIC | 0 refills | Status: DC

## 2020-02-01 NOTE — Telephone Encounter (Signed)
The drops that are available do not have sulfacetamide and predisolone together.  They do have the medication available in eye drops but they are separate.   Okay to send in separately?   If so, same sig at the combination eye drop?

## 2020-03-20 ENCOUNTER — Ambulatory Visit: Payer: 59 | Admitting: Family

## 2020-03-22 ENCOUNTER — Ambulatory Visit: Payer: 59 | Admitting: Family

## 2020-03-22 ENCOUNTER — Other Ambulatory Visit: Payer: Self-pay

## 2020-03-22 ENCOUNTER — Encounter: Payer: Self-pay | Admitting: Family

## 2020-03-22 VITALS — BP 124/78 | HR 84 | Temp 98.2°F | Ht 70.0 in | Wt 230.0 lb

## 2020-03-22 DIAGNOSIS — Z23 Encounter for immunization: Secondary | ICD-10-CM | POA: Diagnosis not present

## 2020-03-22 DIAGNOSIS — M25511 Pain in right shoulder: Secondary | ICD-10-CM | POA: Diagnosis not present

## 2020-03-22 DIAGNOSIS — R1031 Right lower quadrant pain: Secondary | ICD-10-CM

## 2020-03-22 LAB — COMPREHENSIVE METABOLIC PANEL
ALT: 15 U/L (ref 0–53)
AST: 15 U/L (ref 0–37)
Albumin: 4.6 g/dL (ref 3.5–5.2)
Alkaline Phosphatase: 45 U/L (ref 39–117)
BUN: 30 mg/dL — ABNORMAL HIGH (ref 6–23)
CO2: 30 mEq/L (ref 19–32)
Calcium: 9.6 mg/dL (ref 8.4–10.5)
Chloride: 103 mEq/L (ref 96–112)
Creatinine, Ser: 1.17 mg/dL (ref 0.40–1.50)
GFR: 66.48 mL/min (ref >60.00)
Glucose, Bld: 87 mg/dL (ref 70–99)
Potassium: 4.6 mEq/L (ref 3.5–5.1)
Sodium: 139 mEq/L (ref 135–145)
Total Bilirubin: 0.9 mg/dL (ref 0.2–1.2)
Total Protein: 7.2 g/dL (ref 6.0–8.3)

## 2020-03-22 NOTE — Progress Notes (Signed)
John Madden is a 64 y.o. male with the following history as recorded in EpicCare:  Patient Active Problem List   Diagnosis Date Noted  . 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 Amantadine HCl 100 MG tablet Take by mouth.    Marland Kitchen aspirin 81 MG chewable tablet Chew by mouth daily.    . carbidopa-levodopa (SINEMET IR) 25-100 MG tablet Take 1 tablet by mouth 3 (three) times daily.    Marland Kitchen ketoconazole (NIZORAL) 2 % shampoo Apply 1 application topically 2 (two) times a week. 120 mL 6  . Multiple Vitamin (MULTIVITAMIN) tablet Take 1 tablet by mouth daily.    . prednisoLONE acetate (PRED MILD) 0.12 % ophthalmic suspension Place 1 drop into the right eye every 4 (four) hours. 10 mL 0  . rasagiline (AZILECT) 1 MG TABS tablet Take 1 mg by mouth daily.    Marland Kitchen sulfacetamide (BLEPH-10) 10 % ophthalmic solution Place 2 drops into the right eye every 3 (three) hours. 30 mL 0  . tadalafil (CIALIS) 5 MG tablet Take by mouth.    . tamsulosin (FLOMAX) 0.4 MG CAPS capsule Take 0.4 mg by mouth every morning.     No current facility-administered medications for this visit.    Allergies: Patient has no known allergies.  Past Medical History:  Diagnosis Date  . Anxiety   . Back pain, chronic   . Bursitis    left shoulder  . Hyperlipidemia     Past Surgical History:  Procedure Laterality Date  . ankle surgery     left ankle  . APPENDECTOMY    . HERNIA REPAIR      Family History  Problem Relation Age of Onset  . Lymphoma Mother        2016 remission  . Lung cancer Father     Social History   Tobacco Use  . Smoking status: Never Smoker  . Smokeless tobacco: Never Used  Substance Use Topics  . Alcohol use: Yes    Comment: moderate    Subjective:  Right shoulder pain x 2-3 weeks; no known injury or trauma; no use of NSAIDs or icing; "burning feel in the shoulder." Prefers not to take any oral  medication at this time; concerned for bursitis;  Also complaining of right sided abdominal pain- started last week and has been slowly improving; however, has history of hernia repair with mesh and is concerned that mesh may have moved; requesting imaging;     Objective:  Vitals:   03/22/20 0947  BP: 124/78  Pulse: 84  Temp: 98.2 F (36.8 C)  TempSrc: Oral  SpO2: 96%  Weight: 230 lb (104.3 kg)  Height: _0  (1.778 m)    General: Well developed, well nourished, in no acute distress  Head: Normocephalic and atraumatic  Lungs: Respirations unlabored; Abdomen: Soft; nontender; nondistended; normoactive bowel sounds; no masses or hepatosplenomegaly  Musculoskeletal: No deformities; no active joint inflammation  Extremities: No edema, cyanosis, clubbing  Vessels: Symmetric bilaterally  Neurologic: Alert and oriented; speech intact; face symmetrical; moves all extremities well; CNII-XII intact without focal deficit   Assessment:  1. RLQ abdominal pain   2. Acute pain of right shoulder     Plan:  1. Most likely muscular; s/p appendectomy but due to history of hernia repair mesh agree imaging is appropriate; update lab and schedule for CT; 2. He will see sports medicine for ultrasound and  possible injection;  This visit occurred during the SARS-CoV-2 public health emergency.  Safety protocols were in place, including screening questions prior to the visit, additional usage of staff PPE, and extensive cleaning of exam room while observing appropriate contact time as indicated for disinfecting solutions.     No follow-ups on file.  Orders Placed This Encounter  Procedures  . CT Abdomen Pelvis W Contrast    Standing Status:   Future    Standing Expiration Date:   03/22/2021    Order Specific Question:   If indicated for the ordered procedure, I authorize the administration of contrast media per Radiology protocol    Answer:   Yes    Order Specific Question:   Preferred imaging  location?    Answer:   [id number]    Order Specific Question:   Is Oral Contrast requested for this exam?    Answer:   Yes, Per Radiology protocol  . Flu Vaccine QUAD 6+ mos PF IM (Fluarix Quad PF)  . Comp Met (CMET)    Standing Status:   Future    Number of Occurrences:   1    Standing Expiration Date:   03/22/2021    Requested Prescriptions    No prescriptions requested or ordered in this encounter

## 2020-03-23 ENCOUNTER — Encounter (INDEPENDENT_AMBULATORY_CARE_PROVIDER_SITE_OTHER): Payer: Self-pay

## 2020-03-25 ENCOUNTER — Ambulatory Visit: Payer: Self-pay

## 2020-03-25 ENCOUNTER — Other Ambulatory Visit: Payer: Self-pay

## 2020-03-25 ENCOUNTER — Ambulatory Visit: Payer: 59 | Admitting: Family Medicine

## 2020-03-25 ENCOUNTER — Encounter: Payer: Self-pay | Admitting: Family Medicine

## 2020-03-25 VITALS — BP 120/86 | HR 93 | Ht 70.0 in | Wt 230.0 lb

## 2020-03-25 DIAGNOSIS — M25511 Pain in right shoulder: Secondary | ICD-10-CM | POA: Diagnosis not present

## 2020-03-25 NOTE — Progress Notes (Signed)
Subjective:    I'm seeing this patient as a consultation for:  John Mourning, FNP. Note will be routed back to referring provider/PCP.  CC: right shoulder pain   HPI: Patient is a 64 year old Male presenting to [facility] for R shoulder pain X2-3 weeks no MOI. Patient locates pain to front of shoulder.describes pain as burning. Patient is concerned for bursitis. 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   Swelling: no Mechanical symptoms: yes Radiating: yes down the arm  Aggravating symptoms: reaching arm up and forward  Tried: tylenol; tens unit   Past medical history, Surgical history, Family history, Social history, Allergies, and medications have been entered into the medical record, reviewed. Parkinson's disease.  Review of Systems: No new headache, visual changes, nausea, vomiting, diarrhea, constipation, dizziness, abdominal pain, skin rash, fevers, chills, night sweats, weight loss, swollen lymph nodes, body aches, joint swelling, muscle aches, chest pain, shortness of breath, mood changes, visual or auditory hallucinations.   Objective:    Vitals:   03/25/20 0941  BP: 120/86  Pulse: 93  SpO2: 98%   General: Well Developed, well nourished, and in no acute distress.  Neuro/Psych: Alert and oriented x3, extra-ocular muscles intact, able to move all 4 extremities, sensation grossly intact.  Bilateral upper extremity tremor at rest Skin: Warm and dry, no rashes noted.  Respiratory: Not using accessory muscles, speaking in full sentences, trachea midline.  Cardiovascular: Pulses palpable, no extremity edema. Abdomen: Does not appear distended. MSK: Right shoulder normal-appearing nontender.  Range of motion abduction limited 100 degrees.  Tatian lumbar spine external rotation 20 degrees by neutral position. Intact strength abduction external/internal rotation. Positive Hawkins and Neer's test positive  empty can test. Negative Yergason's and speeds test. Pulses cap refill and sensation intact distally.  Lab and Radiology Results Procedure: Real-time Ultrasound Guided Injection of right shoulder subacromial bursa Device: Philips Affiniti 50G Images permanently stored and available for review in PACS Examination of the shoulder prior to injection reveals intact rotator cuff tendons.  Large subacromial bursa present.  Possible this large subacromial bursa is actually a incomplete bursal sided rotator cuff tear although this is less likely.  AC DJD also present. Verbal informed consent obtained.  Discussed risks and benefits of procedure. Warned about infection bleeding damage to structures skin hypopigmentation and fat atrophy among others. Patient expresses understanding and agreement Time-out conducted.   Noted no overlying erythema, induration, or other signs of local infection.   Skin prepped in a sterile fashion.   Local anesthesia: Topical Ethyl chloride.   With sterile technique and under real time ultrasound guidance:  40 mg of Kenalog and 2 mL of Marcaine injected into the acromial bursa. Fluid seen entering the bursa.   Completed without difficulty   Pain immediately resolved suggesting accurate placement of the medication.   Advised to call if fevers/chills, erythema, induration, drainage, or persistent bleeding.   Images permanently stored and available for review in the ultrasound unit.  Impression: Technically successful ultrasound guided injection.       Impression and Recommendations:    Assessment and Plan: 63 y.o. male with right shoulder pain due to subacromial bursitis.  Plan for home exercise program and injection as above.  Also recommend Voltaren gel.  If not improving patient will notify me we will proceed with physical therapy.  Eventually may need x-ray and MRI..   Orders Placed This Encounter  Procedures  .  [place] LIMITED JOINT SPACE STRUCTURES UP RIGHT(NO  LINKED CHARGES)    Standing Status:   Future    Number of Occurrences:   1    Standing Expiration Date:   09/23/2020    Order Specific Question:   Reason for Exam (SYMPTOM  OR DIAGNOSIS REQUIRED)    Answer:   Right shoulder pain    Order Specific Question:   Preferred imaging location?    Answer:   [facility]   No orders of the defined types were placed in this encounter.   Discussed warning signs or symptoms. Please see discharge instructions. Patient expresses understanding.   The above documentation has been reviewed and is accurate and complete Lynne Leader, M.D.

## 2020-03-25 NOTE — Patient Instructions (Signed)
Thank you for coming in today.  Plan for home exercises.  If not better in a few weeks let me know and I will order PT.  Let me know your preferred location.   If not better will get xray then MRI.   Can repeat shot if needed.   Call or go to the ER if you develop a large red swollen joint with extreme pain or oozing puss.

## 2020-04-05 ENCOUNTER — Ambulatory Visit
Admission: RE | Admit: 2020-04-05 | Discharge: 2020-04-05 | Disposition: A | Discharge status: Home / Self Care | Payer: 59 | Source: Ambulatory Visit | Attending: Family | Admitting: Family

## 2020-04-05 DIAGNOSIS — R1031 Right lower quadrant pain: Secondary | ICD-10-CM

## 2020-04-05 MED ORDER — IOPAMIDOL (ISOVUE-300) INJECTION 61%
100.0000 mL | Freq: Once | INTRAVENOUS | Status: AC | PRN
  Administered 2020-04-05: 100 mL via INTRAVENOUS

## 2020-04-08 ENCOUNTER — Encounter: Payer: Self-pay | Admitting: Family

## 2020-06-11 ENCOUNTER — Other Ambulatory Visit: Payer: 59

## 2020-06-11 DIAGNOSIS — Z20822 Contact with and (suspected) exposure to covid-19: Secondary | ICD-10-CM

## 2020-06-12 LAB — NOVEL CORONAVIRUS, NAA: SARS-CoV-2, NAA: NOT DETECTED

## 2020-06-12 LAB — SARS-COV-2, NAA 2 DAY TAT

## 2020-07-29 ENCOUNTER — Ambulatory Visit: Payer: 59 | Admitting: Family

## 2020-07-29 ENCOUNTER — Encounter: Payer: Self-pay | Admitting: Family

## 2020-07-29 ENCOUNTER — Other Ambulatory Visit: Payer: Self-pay

## 2020-07-29 VITALS — BP 140/90 | HR 87 | Temp 98.4°F | Ht 70.0 in | Wt 233.0 lb

## 2020-07-29 DIAGNOSIS — R030 Elevated blood-pressure reading, without diagnosis of hypertension: Secondary | ICD-10-CM

## 2020-07-29 DIAGNOSIS — M25511 Pain in right shoulder: Secondary | ICD-10-CM | POA: Diagnosis not present

## 2020-07-29 LAB — COMPREHENSIVE METABOLIC PANEL
ALT: 7 U/L (ref 0–53)
AST: 15 U/L (ref 0–37)
Albumin: 4.5 g/dL (ref 3.5–5.2)
Alkaline Phosphatase: 43 U/L (ref 39–117)
BUN: 22 mg/dL (ref 6–23)
CO2: 30 mEq/L (ref 19–32)
Calcium: 9.5 mg/dL (ref 8.4–10.5)
Chloride: 105 mEq/L (ref 96–112)
Creatinine, Ser: 1.07 mg/dL (ref 0.40–1.50)
GFR: 74.52 mL/min (ref >60.00)
Glucose, Bld: 79 mg/dL (ref 70–99)
Potassium: 3.9 mEq/L (ref 3.5–5.1)
Sodium: 141 mEq/L (ref 135–145)
Total Bilirubin: 0.6 mg/dL (ref 0.2–1.2)
Total Protein: 7.3 g/dL (ref 6.0–8.3)

## 2020-07-29 LAB — CBC WITH DIFFERENTIAL/PLATELET
Basophils Absolute: 0 10*3/uL (ref 0.0–0.1)
Basophils Relative: 0.5 % (ref 0.0–3.0)
Eosinophils Absolute: 0.2 10*3/uL (ref 0.0–0.7)
Eosinophils Relative: 2.7 % (ref 0.0–5.0)
HCT: 47.4 % (ref 39.0–52.0)
Hemoglobin: 16.3 g/dL (ref 13.0–17.0)
Lymphocytes Relative: 28.1 % (ref 12.0–46.0)
Lymphs Abs: 2.2 10*3/uL (ref 0.7–4.0)
MCHC: 34.4 g/dL (ref 30.0–36.0)
MCV: 93.2 fl (ref 78.0–100.0)
Monocytes Absolute: 0.5 10*3/uL (ref 0.1–1.0)
Monocytes Relative: 6.7 % (ref 3.0–12.0)
Neutro Abs: 4.8 10*3/uL (ref 1.4–7.7)
Neutrophils Relative %: 62 % (ref 43.0–77.0)
Platelets: 149 10*3/uL — ABNORMAL LOW (ref 150.0–400.0)
RBC: 5.08 Mil/uL (ref 4.22–5.81)
RDW: 12.9 % (ref 11.5–15.5)
WBC: 7.7 10*3/uL (ref 4.0–10.5)

## 2020-07-29 MED ORDER — TAMSULOSIN HCL 0.4 MG PO CAPS: 0.4000 mg | ORAL_CAPSULE | Freq: Every morning | ORAL | 1 refills | Status: DC

## 2020-07-29 NOTE — Progress Notes (Signed)
John Madden is a 65 y.o. male with the following history as recorded in EpicCare:  Patient Active Problem List   Diagnosis Date Noted  . 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 Amantadine HCl 100 MG tablet Take by mouth.    . carbidopa-levodopa (SINEMET IR) 25-100 MG tablet Take 1 tablet by mouth 3 (three) times daily.    Marland Kitchen ketoconazole (NIZORAL) 2 % shampoo Apply 1 application topically 2 (two) times a week. 120 mL 6  . Multiple Vitamin (MULTIVITAMIN) tablet Take 1 tablet by mouth daily.    . rasagiline (AZILECT) 1 MG TABS tablet Take 1 mg by mouth daily.    . tadalafil (CIALIS) 5 MG tablet Take by mouth.    . tamsulosin (FLOMAX) 0.4 MG CAPS capsule Take 1 capsule (0.4 mg total) by mouth every morning. 90 capsule 1   No current facility-administered medications for this visit.    Allergies: Patient has no known allergies.  Past Medical History:  Diagnosis Date  . Anxiety   . Back pain, chronic   . Bursitis    left shoulder  . Hyperlipidemia     Past Surgical History:  Procedure Laterality Date  . ankle surgery     left ankle  . APPENDECTOMY    . HERNIA REPAIR      Family History  Problem Relation Age of Onset  . Lymphoma Mother        2016 remission  . Lung cancer Father     Social History   Tobacco Use  . Smoking status: Never Smoker  . Smokeless tobacco: Never Used  Substance Use Topics  . Alcohol use: Yes    Comment: moderate    Subjective:   Patient has noticed that his blood pressure has been "borderline" recently; is actively working on losing weight- feels that he has lost 3 pounds in the past week with increased exercise; Denies any chest pain, shortness of breath, blurred vision or headache;  Requesting referral back to sports medicine to discuss chronic right shoulder pain;  Needs refill on Flomax  Vitals:   07/29/20 0936  BP: 140/90   Pulse: 87  Temp: 98.4 F (36.9 C)  TempSrc: Oral  SpO2: 98%  Weight: 233 lb (105.7 kg)  Height: '5\' 10"'  (1.778 m)    General: Well developed, well nourished, in no acute distress  Skin : Warm and dry.  Head: Normocephalic and atraumatic  Lungs: Respirations unlabored; clear to auscultation bilaterally without wheeze, rales, rhonchi  CVS exam: normal rate and regular rhythm.  Neurologic: Alert and oriented; speech intact; face symmetrical; moves all extremities well; CNII-XII intact without focal deficit   Assessment:  1. Elevated blood pressure reading   2. Acute pain of right shoulder     Plan:  1. DASH diet discussed; agree with plan to work on diet/ exercise for now;he will send readings again in another month and will see what the trend is at that time; 2. Refer back to see Dr. Georgina Snell;  No follow-ups on file.  Orders Placed This Encounter  Procedures  . CBC with Differential/Platelet    Standing Status:   Future    Number of Occurrences:   1    Standing Expiration Date:   07/29/2021  . Comp Met (CMET)    Standing Status:   Future    Number of Occurrences:   1    Standing  Expiration Date:   07/29/2021    Requested Prescriptions   Signed Prescriptions Disp Refills  . tamsulosin (FLOMAX) 0.4 MG CAPS capsule 90 capsule 1    Sig: Take 1 capsule (0.4 mg total) by mouth every morning.

## 2020-07-29 NOTE — Progress Notes (Unsigned)
I, Wendy Poet, LAT, ATC, am serving as scribe for Dr. Lynne Leader.  John Madden is a 65 y.o. male who presents to [facility] at [facility] today for f/u of R shoulder pain.  He was last seen by Dr. Georgina Snell on 03/25/20 w/ acute-onset shoulder pain and had a R subacromial injection.  He was shown a HEP by Dr. Georgina Snell.  Since his last visit, pt reports that his R shoulder pain has increased over the past 3-4 weeks.  He notes some radiating pain into the R side of his neck.  His last injection helped for approximately 3 months.  He does not think he can do physical therapy because his significant other has MS and he has to care for her.   Pertinent review of systems: No fevers or chills  Relevant historical information: HTN   Exam:  BP 120/84 (BP Location: Left Arm, Patient Position: Sitting, Cuff Size: Large)   Pulse 80   Ht 5\' 10"  (1.778 m)   Wt 236 lb 9.6 oz (107.3 kg)   SpO2 98%   BMI 33.95 kg/m  General: Well Developed, well nourished, and in no acute distress.   MSK: Right shoulder normal-appearing nontender normal motion pain with abduction. Positive Hawkins and Neer's test. Intact strength. Negative Yergason's and speeds test.    Lab and Radiology Results  Procedure: Real-time Ultrasound Guided Injection of right shoulder subacromial bursa Device: Philips Affiniti 50G Images permanently stored and available for review in PACS Verbal informed consent obtained.  Discussed risks and benefits of procedure. Warned about infection bleeding damage to structures skin hypopigmentation and fat atrophy among others. Patient expresses understanding and agreement Time-out conducted.   Noted no overlying erythema, induration, or other signs of local infection.   Skin prepped in a sterile fashion.   Local anesthesia: Topical Ethyl chloride.   With sterile technique and under real time ultrasound guidance:  40 mg of Kenalog and 2 mL of Marcaine injected into right  subacromial bursa. Fluid seen entering the bursa.   Completed without difficulty   Pain immediately resolved suggesting accurate placement of the medication.   Advised to call if fevers/chills, erythema, induration, drainage, or persistent bleeding.   Images permanently stored and available for review in the ultrasound unit.  Impression: Technically successful ultrasound guided injection.       Assessment and Plan: 65 y.o. male with right shoulder pain due to recurrent subacromial bursitis.  Plan for repeat steroid injection today.  Stressed the importance of home exercise program.  Patient would be a good physical therapy candidate but his life right now is not very compatible with formal physical therapy.  Recheck back as needed.  We will be happy to repeat the injection again in the future however ultimately if this becomes a pattern would like to proceed with more definitive treatment including potentially MRI and surgical planning.   PDMP not reviewed this encounter. Orders Placed This Encounter  Procedures  . [place] LIMITED JOINT SPACE STRUCTURES UP RIGHT(NO LINKED CHARGES)    Order Specific Question:   Reason for Exam (SYMPTOM  OR DIAGNOSIS REQUIRED)    Answer:   R shoulder pain    Order Specific Question:   Preferred imaging location?    Answer:   [facility]   No orders of the defined types were placed in this encounter.    Discussed warning signs or symptoms. Please see discharge instructions. Patient expresses understanding.   The above documentation has been  reviewed and is accurate and complete Lynne Leader, M.D.

## 2020-07-29 NOTE — Patient Instructions (Signed)

## 2020-07-30 ENCOUNTER — Encounter: Payer: Self-pay | Admitting: Family Medicine

## 2020-07-30 ENCOUNTER — Ambulatory Visit: Payer: 59 | Admitting: Family Medicine

## 2020-07-30 ENCOUNTER — Ambulatory Visit: Payer: Self-pay

## 2020-07-30 VITALS — BP 120/84 | HR 80 | Ht 70.0 in | Wt 236.6 lb

## 2020-07-30 DIAGNOSIS — M25511 Pain in right shoulder: Secondary | ICD-10-CM

## 2020-07-30 NOTE — Patient Instructions (Addendum)
You had a R shoulder injection today.  Call or go to the ER if you develop a large red swollen joint with extreme pain or oozing puss.   Recheck as needed.   Work on those home exercises.   Consider physical therapy.

## 2020-08-14 ENCOUNTER — Other Ambulatory Visit: Payer: Self-pay | Admitting: Family

## 2020-08-14 ENCOUNTER — Encounter: Payer: Self-pay | Admitting: Family

## 2020-08-14 MED ORDER — TOBRAMYCIN 0.3 % OP SOLN: OPHTHALMIC | 0 refills | Status: DC

## 2020-09-15 ENCOUNTER — Encounter: Payer: Self-pay | Admitting: Family

## 2020-09-16 ENCOUNTER — Other Ambulatory Visit: Payer: Self-pay | Admitting: Family

## 2020-09-16 MED ORDER — TADALAFIL 5 MG PO TABS: 5.0000 mg | ORAL_TABLET | Freq: Every day | ORAL | 0 refills | Status: DC | PRN

## 2020-11-25 ENCOUNTER — Other Ambulatory Visit: Payer: Self-pay | Admitting: Chiropractic Medicine

## 2020-11-25 ENCOUNTER — Other Ambulatory Visit: Payer: Self-pay

## 2020-11-25 ENCOUNTER — Ambulatory Visit
Admission: RE | Admit: 2020-11-25 | Discharge: 2020-11-25 | Disposition: A | Discharge status: Home / Self Care | Payer: 59 | Source: Ambulatory Visit | Attending: Chiropractic Medicine | Admitting: Chiropractic Medicine

## 2020-11-25 DIAGNOSIS — M542 Cervicalgia: Secondary | ICD-10-CM

## 2020-11-25 DIAGNOSIS — M25511 Pain in right shoulder: Secondary | ICD-10-CM

## 2020-12-05 ENCOUNTER — Encounter (HOSPITAL_COMMUNITY): Payer: Self-pay | Admitting: Pharmacy Technician

## 2020-12-05 ENCOUNTER — Emergency Department (HOSPITAL_COMMUNITY): Payer: 59

## 2020-12-05 ENCOUNTER — Emergency Department (HOSPITAL_COMMUNITY)
Admission: EM | Admit: 2020-12-05 | Discharge: 2020-12-05 | Disposition: A | Discharge status: Home / Self Care | Payer: 59 | Attending: Emergency Medicine | Admitting: Emergency Medicine

## 2020-12-05 ENCOUNTER — Telehealth: Payer: Self-pay

## 2020-12-05 DIAGNOSIS — R109 Unspecified abdominal pain: Secondary | ICD-10-CM | POA: Insufficient documentation

## 2020-12-05 DIAGNOSIS — K625 Hemorrhage of anus and rectum: Secondary | ICD-10-CM | POA: Insufficient documentation

## 2020-12-05 LAB — COMPREHENSIVE METABOLIC PANEL
ALT: 12 U/L (ref 0–44)
AST: 35 U/L (ref 15–41)
Albumin: 4.2 g/dL (ref 3.5–5.0)
Alkaline Phosphatase: 42 U/L (ref 38–126)
Anion gap: 8 (ref 5–15)
BUN: 26 mg/dL — ABNORMAL HIGH (ref 8–23)
CO2: 24 mmol/L (ref 22–32)
Calcium: 9.3 mg/dL (ref 8.9–10.3)
Chloride: 106 mmol/L (ref 98–111)
Creatinine, Ser: 1.11 mg/dL (ref 0.61–1.24)
GFR, Estimated: >60 mL/min (ref >60)
Glucose, Bld: 107 mg/dL — ABNORMAL HIGH (ref 70–99)
Potassium: 5.1 mmol/L (ref 3.5–5.1)
Sodium: 138 mmol/L (ref 135–145)
Total Bilirubin: 0.8 mg/dL (ref 0.3–1.2)
Total Protein: 6.3 g/dL — ABNORMAL LOW (ref 6.5–8.1)

## 2020-12-05 LAB — CBC WITH DIFFERENTIAL/PLATELET
Abs Immature Granulocytes: 0.03 10*3/uL (ref 0.00–0.07)
Basophils Absolute: 0 10*3/uL (ref 0.0–0.1)
Basophils Relative: 1 %
Eosinophils Absolute: 0.2 10*3/uL (ref 0.0–0.5)
Eosinophils Relative: 2 %
HCT: 50.6 % (ref 39.0–52.0)
Hemoglobin: 16.6 g/dL (ref 13.0–17.0)
Immature Granulocytes: 0 %
Lymphocytes Relative: 26 %
Lymphs Abs: 2 10*3/uL (ref 0.7–4.0)
MCH: 31.4 pg (ref 26.0–34.0)
MCHC: 32.8 g/dL (ref 30.0–36.0)
MCV: 95.7 fL (ref 80.0–100.0)
Monocytes Absolute: 0.6 10*3/uL (ref 0.1–1.0)
Monocytes Relative: 7 %
Neutro Abs: 4.9 10*3/uL (ref 1.7–7.7)
Neutrophils Relative %: 64 %
Platelets: 157 10*3/uL (ref 150–400)
RBC: 5.29 MIL/uL (ref 4.22–5.81)
RDW: 12.3 % (ref 11.5–15.5)
WBC: 7.7 10*3/uL (ref 4.0–10.5)
nRBC: 0 % (ref 0.0–0.2)

## 2020-12-05 LAB — TYPE AND SCREEN
ABO/RH(D): A POS
Antibody Screen: NEGATIVE

## 2020-12-05 LAB — POC OCCULT BLOOD, ED: Fecal Occult Bld: POSITIVE — AB

## 2020-12-05 MED ORDER — IOHEXOL 300 MG/ML  SOLN
100.0000 mL | Freq: Once | INTRAMUSCULAR | Status: AC | PRN
  Administered 2020-12-05: 100 mL via INTRAVENOUS

## 2020-12-05 NOTE — ED Notes (Signed)
E-signature pad unavailable at time of pt discharge. This RN discussed discharge materials with pt and answered all pt questions. Pt stated understanding of discharge material. ? ?

## 2020-12-05 NOTE — ED Triage Notes (Signed)
Pt here with reports of seeing bright red blood on the toilet today. Reports abd pain last week.

## 2020-12-05 NOTE — ED Notes (Signed)
Patient transported to CT 

## 2020-12-05 NOTE — Discharge Instructions (Addendum)
Make an appointment with your primary care doctor for recheck of blood count.  Make an appointment with [facility] gastroenterology for consideration for colonoscopy and further evaluation of the rectal bleeding.  Return for any blood staining the toilet water bright red x2 in a day.  Or for vomiting blood.  Or for feeling like you are going to pass out.

## 2020-12-05 NOTE — ED Provider Notes (Addendum)
Emergency Medicine Provider Triage Evaluation Note  John Madden , a 65 y.o. male  was evaluated in triage.  Pt complains of rectal bleeding.  States that his bright red blood on the toilet seat after using the bathroom this morning.  He had some abdominal cramping a week ago but none since.  Reports he has history of constipation, but unknown if he has any hemorrhoids. Not on blood thinners.   Review of Systems  Positive: Rectal bleeding, constipation  Negative: Fatigue, dizziness, SOB, chest pain  Physical Exam  BP (!) 134/98   Pulse 89   Temp 98.5 F (36.9 C) (Oral)   Resp 16   SpO2 99%  Gen:   Awake, no distress   Resp:  Normal effort  MSK:   Moves extremities without difficulty  Other:  No abdominal tenderness   Medical Decision Making  Medically screening exam initiated at 11:34 AM.  Appropriate orders placed.  Benny Balestrieri was informed that the remainder of the evaluation will be completed by another provider, this initial triage assessment does not replace that evaluation, and the importance of remaining in the ED until their evaluation is complete.     Sherrill Raring, PA-C 12/05/20 1135    Sherrill Raring, PA-C 12/05/20 1135    Quintella Reichert, MD 12/05/20 [phone]

## 2020-12-05 NOTE — ED Provider Notes (Signed)
[place] EMERGENCY DEPARTMENT Provider Note   CSN: [id number] Arrival date & time: 12/05/20  1109     History No chief complaint on file.   John Madden is a 65 y.o. male.  Patient with a history of some red blood with bowel movements occasionally for 6 to 8 months.  This morning started with red blood per rectum.  Not necessarily in the bowel movements.  But on the back of the toilet seat.  Seems to have settled down at this point in time.  But patient feels like there is some oozing.  No rectal pain.  A little bit of abdominal discomfort.  No vomiting of blood.  Not feeling lightheaded or feeling like he is got a passout.  Patient's had a colonoscopy in the past but wife says it was more than a year ago.  Patient thought it could be due to internal hemorrhoids.      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 Authorizing Provider  Amantadine HCl 100 MG tablet Take 150 mg by mouth in the morning, at noon, and at bedtime. 12/27/19  Yes [provider]  carbidopa-levodopa (SINEMET IR) 25-100 MG tablet Take 1-2 tablets by mouth in the morning and at bedtime. 2 tabs qam, 1 tab qpm 12/18/19  Yes [provider]  ketoconazole (NIZORAL) 2  % shampoo Apply 1 application topically 2 (two) times a week. 01/01/20  Yes Marrian Salvage, FNP  Multiple Vitamin (MULTIVITAMIN) tablet Take 1 tablet by mouth daily.   Yes [provider]  tadalafil (CIALIS) 5 MG tablet Take 1 tablet (5 mg total) by mouth daily as needed for erectile dysfunction. 09/16/20  Yes Marrian Salvage, FNP  tamsulosin (FLOMAX) 0.4 MG CAPS capsule Take 1 capsule (0.4 mg total) by mouth every morning. 07/29/20  Yes Marrian Salvage, FNP  tobramycin (TOBREX) 0.3 % ophthalmic solution Use 1 drop in the affected eye q 6 hours as directed Patient not taking: Reported on 12/05/2020 08/14/20   Marrian Salvage, [place]    Allergies    Patient has no known allergies.  Review of Systems   Review of Systems  Constitutional:  Negative for chills and fever.  HENT:  Negative for ear pain and sore throat.   Eyes:  Negative for pain and visual disturbance.  Respiratory:  Negative for cough and shortness of breath.   Cardiovascular:  Negative for chest pain and palpitations.  Gastrointestinal:  Positive for abdominal pain and blood in stool. Negative for vomiting.  Genitourinary:  Negative for dysuria and hematuria.  Musculoskeletal:  Negative for arthralgias and back pain.  Skin:  Negative for color change and rash.  Neurological:  Negative for seizures and syncope.  Hematological:  Does not bruise/bleed easily.  All other systems reviewed and are negative.  Physical Exam Updated Vital Signs BP 116/88   Pulse 79   Temp 98.5 F (36.9 C) (Oral)   Resp 18   SpO2 96%   Physical Exam Vitals and nursing note reviewed.  Constitutional:      General: He is not in acute distress.    Appearance: Normal appearance. He is well-developed. He is not ill-appearing.  HENT:     Head: Normocephalic and atraumatic.  Eyes:     Conjunctiva/sclera: Conjunctivae normal.  Cardiovascular:     Rate and Rhythm: Normal rate and regular rhythm.     Heart sounds: No  murmur heard. Pulmonary:     Effort: Pulmonary effort is normal. No respiratory distress.     Breath sounds: Normal breath sounds.  Abdominal:     Palpations: Abdomen is soft.     Tenderness: There is no abdominal tenderness.  Genitourinary:    Rectum: Normal. Guaiac result positive.     Comments: No fissure.  No pain with rectal exam.  No mass.  Stool in the rectal vault was actually light brown.  But there was smearing of blood around the perianal area.  No active bleeding.  No external hemorrhoids no prolapsed internal hemorrhoid. Musculoskeletal:     Cervical back: Neck supple.     Right lower leg: No edema.     Left lower leg: No edema.  Skin:    General: Skin is warm and dry.     Capillary Refill: Capillary refill takes less than 2 seconds.  Neurological:     General: No focal deficit present.     Mental Status: He is alert and oriented to person, place, and time.     Cranial Nerves: No cranial nerve deficit.     Sensory: No sensory deficit.     Motor: No weakness.    ED Results / Procedures / Treatments   Labs (all labs ordered are listed, but only abnormal results are displayed) Labs Reviewed  COMPREHENSIVE METABOLIC PANEL - Abnormal; Notable for the following components:      Result Value   Glucose, Bld 107 (*)    BUN 26 (*)    Total Protein 6.3 (*)    All other components within normal limits  POC OCCULT BLOOD, ED - Abnormal; Notable for the following components:   Fecal Occult Bld POSITIVE (*)    All other components within normal limits  CBC WITH DIFFERENTIAL/PLATELET  TYPE AND SCREEN  ABO/RH    EKG None  Radiology CT Abdomen Pelvis W Contrast  Result Date: 12/05/2020 CLINICAL DATA:  Bright red blood per rectum, recent unspecified abdominal pain EXAM: CT ABDOMEN AND PELVIS WITH CONTRAST TECHNIQUE: Multidetector CT imaging of the abdomen and pelvis was performed using the standard protocol following bolus administration of intravenous contrast. CONTRAST:   170mL OMNIPAQUE IOHEXOL 300 MG/ML  SOLN COMPARISON:  04/05/2020 FINDINGS: Lower chest: The visualized lung bases are clear bilaterally. The visualized heart and pericardium are unremarkable. Hepatobiliary: Mild hepatic steatosis. No enhancing intrahepatic mass identified. No intra or extrahepatic biliary ductal dilation. Gallbladder unremarkable. Pancreas: Unremarkable Spleen: Unremarkable Adrenals/Urinary Tract: The adrenal glands are unremarkable. The kidneys are normal in size and position. Simple cortical cyst noted exophytically from the lower pole  of the left kidney. The kidneys are otherwise unremarkable. The bladder is unremarkable. Stomach/Bowel: The stomach, small bowel, and large bowel are unremarkable. The appendix is absent. No free intraperitoneal gas or fluid. Vascular/Lymphatic: Retroaortic left renal vein. Moderate aortoiliac atherosclerotic calcification. No aortic aneurysm. No pathologic adenopathy within the abdomen and pelvis. Reproductive: Prostate is unremarkable. Other: Ventral/umbilical hernia repair with mesh has been performed. No abdominal wall hernia identified. The rectum is unremarkable. Musculoskeletal: No acute bone abnormality. No lytic or blastic bone lesions identified. IMPRESSION: No acute intra-abdominal pathology identified. No definite radiographic explanation for the patient's reported symptoms. Aortic Atherosclerosis (ICD10-I70.0). Electronically Signed   By: Fidela Salisbury MD   On: 12/05/2020 20:28    Procedures Procedures   Medications Ordered in ED Medications  iohexol (OMNIPAQUE) 300 MG/ML solution 100 mL (100 mLs Intravenous Contrast Given 12/05/20 2013)    ED Course  I have reviewed the triage vital signs and the nursing notes.  Pertinent labs & imaging results that were available during my care of the patient were reviewed by me and considered in my medical decision making (see chart for details).    MDM Rules/Calculators/A&P                           Rectal exam here with some smearing of blood in the perianal area.  But there was light brown stool in the rectal vault.  Which was not covered in blood.  Obviously not melanotic in nature.  Or maroon.  Hemoccult positive but we knew it would be.  CBC here hemoglobin is 16.6.  Platelets are normal.  Normal white blood cell count.  Liver function test are significant for BUN of 26.  Otherwise liver function tests were normal.  Creatinine was normal at 1.11.  Potassium was 5.1 slight hemolysis.  CT scan of the abdomen without any acute findings.  Patient hemodynamically stable here.  Hemoglobin stable.  We will have patient follow-up with primary care doctor for CBC recheck.  We will refer him to [facility]enterology for follow-up and consideration for colonoscopy.  Patient will return for any worse bleeding.   Final Clinical Impression(s) / ED Diagnoses Final diagnoses:  Rectal bleeding    Rx / DC Orders ED Discharge Orders     None        Fredia Sorrow, MD 12/05/20 2224

## 2020-12-05 NOTE — ED Notes (Signed)
Pt ambulated self to bathroom

## 2020-12-05 NOTE — Telephone Encounter (Signed)
Nurse Assessment Nurse: Altamease Oiler, RN, Adriana Date/Time (Eastern Time): 12/05/2020 10:41:09 AM Confirm and document reason for call. If symptomatic, describe symptoms. ---pt states there was bleeding when he wiped this AM. is happening without passage of stools. had abd pain over the weekend Does the patient have any new or worsening symptoms? ---Yes Will a triage be completed? ---Yes Related visit to physician within the last 2 weeks? ---No Does the PT have any chronic conditions? (i.e. diabetes, asthma, this includes High risk factors for pregnancy, etc.) ---Yes List chronic conditions. ---parkinson's Is this a behavioral health or substance abuse call? ---No Guidelines Guideline Title Affirmed Question Affirmed Notes Nurse Date/Time (Eastern Time) Rectal Bleeding [1] MODERATE rectal bleeding (small blood clots, passing blood without stool, or toilet water turns red) AND [2] more than once a day Altamease Oiler, Therapist, sports, Fabio Bering 12/05/2020 10:42:53 AM Disp. Time Eilene Ghazi Time) Disposition Final User 12/05/2020 10:44:18 AM Go to ED Now Yes Altamease Oiler, RN, Adriana PLEASE NOTE: All timestamps contained within this report are represented as [place] Standard Time. CONFIDENTIALTY NOTICE: This fax transmission is intended only for the addressee. It contains information that is legally privileged, confidential or otherwise protected from use or disclosure. If you are not the intended recipient, you are strictly prohibited from reviewing, disclosing, copying using or disseminating any of this information or taking any action in reliance on or regarding this information. If you have received this fax in error, please notify us immediately by telephone so that we can arrange for its return to [place]. Phone: [phone], Toll-Free: [phone], Fax: [phone] Page: 2 of 2 Call Id: [id number] Caller Disagree/Comply Comply Caller Understands Yes PreDisposition Call Doctor Care Advice Given Per Guideline GO TO ED  NOW: CARE ADVICE given per Rectal Bleeding (Adult) guideline. Referrals GO TO FACILITY OTHER - SPECIFY   Pt currently in ED.

## 2020-12-06 ENCOUNTER — Other Ambulatory Visit: Payer: Self-pay | Admitting: Family

## 2020-12-06 ENCOUNTER — Encounter: Payer: Self-pay | Admitting: Family

## 2020-12-06 DIAGNOSIS — K625 Hemorrhage of anus and rectum: Secondary | ICD-10-CM

## 2020-12-13 NOTE — Telephone Encounter (Signed)
I spoke to patient; he tested positive yesterday but has actually had symptoms since last Saturday evening; Notes he is feeling better and like his symptoms are on the mend; no chest pain or problems breathing; will actually cancel his appt on 7/5 since it is virtual and re-schedule for in person on 01/17/21; he is in agreement with this.

## 2020-12-17 ENCOUNTER — Telehealth: Payer: 59 | Admitting: Family

## 2021-01-17 ENCOUNTER — Other Ambulatory Visit: Payer: Self-pay

## 2021-01-17 ENCOUNTER — Ambulatory Visit: Payer: 59 | Admitting: Family

## 2021-01-17 VITALS — BP 124/70 | HR 89 | Temp 98.0°F | Ht 69.0 in | Wt 218.6 lb

## 2021-01-17 DIAGNOSIS — K625 Hemorrhage of anus and rectum: Secondary | ICD-10-CM

## 2021-01-17 LAB — CBC WITH DIFFERENTIAL/PLATELET
Basophils Absolute: 0 10*3/uL (ref 0.0–0.1)
Basophils Relative: 0.4 % (ref 0.0–3.0)
Eosinophils Absolute: 0.1 10*3/uL (ref 0.0–0.7)
Eosinophils Relative: 2.1 % (ref 0.0–5.0)
HCT: 47.8 % (ref 39.0–52.0)
Hemoglobin: 16.1 g/dL (ref 13.0–17.0)
Lymphocytes Relative: 25.9 % (ref 12.0–46.0)
Lymphs Abs: 1.7 10*3/uL (ref 0.7–4.0)
MCHC: 33.7 g/dL (ref 30.0–36.0)
MCV: 92.9 fl (ref 78.0–100.0)
Monocytes Absolute: 0.5 10*3/uL (ref 0.1–1.0)
Monocytes Relative: 7.8 % (ref 3.0–12.0)
Neutro Abs: 4.1 10*3/uL (ref 1.4–7.7)
Neutrophils Relative %: 63.8 % (ref 43.0–77.0)
Platelets: 145 10*3/uL — ABNORMAL LOW (ref 150.0–400.0)
RBC: 5.15 Mil/uL (ref 4.22–5.81)
RDW: 13.2 % (ref 11.5–15.5)
WBC: 6.4 10*3/uL (ref 4.0–10.5)

## 2021-01-17 NOTE — Patient Instructions (Signed)
Please check with your neurologist about getting the Shingrix; if he says yes, let me know and we can check about chicken pox;

## 2021-01-17 NOTE — Progress Notes (Signed)
John Madden is a 65 y.o. 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     carbidopa-levodopa (SINEMET IR) 25-100 MG tablet Take 1-2 tablets by mouth in the morning and at bedtime. 2 tabs qam, 1 tab qpm     ketoconazole (NIZORAL) 2 % shampoo Apply 1 application topically 2 (two) times a week. 120 mL 6   Multiple Vitamin (MULTIVITAMIN) tablet Take 1 tablet by mouth daily.     tadalafil (CIALIS) 5 MG tablet Take 1 tablet (5 mg total) by mouth daily as needed for erectile dysfunction. 90 tablet 0   tamsulosin (FLOMAX) 0.4 MG CAPS capsule Take 1 capsule (0.4 mg total) by mouth every morning. 90 capsule 1   No current facility-administered medications for this visit.    Allergies: Patient has no known allergies.  Past Medical History:  Diagnosis Date   Anxiety    Back pain, chronic    Bursitis    left shoulder   Hyperlipidemia     Past Surgical History:  Procedure Laterality Date   ankle surgery     left ankle   APPENDECTOMY     HERNIA REPAIR      Family History  Problem Relation Age of Onset   Lymphoma Mother        2016 remission   Lung cancer Father     Social History   Tobacco Use   Smoking status: Never   Smokeless tobacco: Never  Substance Use Topics   Alcohol use: Yes    Comment: moderate    Subjective:  Was seen in ER at the end of June with concerns for bright red rectal bleeding; exam at ER was unremarkable and symptoms felt to be related to hemorrhoids; per patient, he has had no further symptoms in the past month or so; up to date on colonoscopy as of 2021;  Is using Miralax regularly and occasionally having to use stool softener; no abdominal pain;  Has  lost 18 pounds in the past few months secondary to increased exercise/ diet changes;     Objective:  Vitals:   01/17/21 0829  BP: 124/70  Pulse: 89  Temp: 98 F (36.7 C)  TempSrc: Oral  SpO2: 99%  Weight: 218 lb 9.6 oz (99.2 kg)  Height: '5\' 9"'$  (1.753 m)    General: Well developed, well nourished, in no acute distress  Skin : Warm and dry.  Head: Normocephalic and atraumatic  Eyes: Sclera and conjunctiva clear; pupils round and reactive to light; extraocular movements intact  Ears: External normal; canals clear; tympanic membranes normal  Oropharynx: Pink, supple. No suspicious lesions  Neck: Supple without thyromegaly, adenopathy  Lungs: Respirations unlabored; clear to auscultation bilaterally without wheeze, rales, rhonchi  CVS exam: normal rate and regular rhythm.  Neurologic: Alert and oriented; speech intact; face symmetrical; moves all extremities well; CNII-XII intact without focal deficit   Assessment:  1. Rectal bleeding     Plan:  Suspect secondary to hemorrhoids; per patient, no further symptoms in the past 4 weeks; will get copy of colonoscopy from 2021; check CBC to ensure no anemia today; follow up worse, no better.  This visit occurred during the SARS-CoV-2  public health emergency.  Safety protocols were in place, including screening questions prior to the visit, additional usage of staff PPE, and extensive cleaning of exam room while observing appropriate contact time as indicated for disinfecting solutions.    No follow-ups on file.  Orders Placed This Encounter  Procedures   CBC with Differential/Platelet    Requested Prescriptions    No prescriptions requested or ordered in this encounter

## 2021-01-19 ENCOUNTER — Other Ambulatory Visit: Payer: Self-pay | Admitting: Family

## 2021-01-20 ENCOUNTER — Encounter: Payer: Self-pay | Admitting: Family

## 2021-01-20 MED ORDER — TADALAFIL 5 MG PO TABS: 5.0000 mg | ORAL_TABLET | Freq: Every day | ORAL | 0 refills | Status: DC | PRN

## 2021-01-23 ENCOUNTER — Other Ambulatory Visit: Payer: Self-pay | Admitting: Family

## 2021-01-28 ENCOUNTER — Encounter: Payer: Self-pay | Admitting: Family

## 2021-01-30 ENCOUNTER — Encounter: Payer: Self-pay | Admitting: Family

## 2021-01-30 ENCOUNTER — Other Ambulatory Visit: Payer: Self-pay

## 2021-01-30 ENCOUNTER — Ambulatory Visit: Payer: 59 | Admitting: Family

## 2021-01-30 VITALS — BP 128/90 | HR 83 | Temp 98.1°F | Resp 16 | Ht 69.0 in | Wt 219.0 lb

## 2021-01-30 DIAGNOSIS — J358 Other chronic diseases of tonsils and adenoids: Secondary | ICD-10-CM | POA: Diagnosis not present

## 2021-01-30 DIAGNOSIS — R591 Generalized enlarged lymph nodes: Secondary | ICD-10-CM

## 2021-01-30 MED ORDER — AMOXICILLIN-POT CLAVULANATE 875-125 MG PO TABS: 1.0000 | ORAL_TABLET | Freq: Two times a day (BID) | ORAL | 0 refills | Status: AC

## 2021-01-30 NOTE — Progress Notes (Signed)
John Madden is a 65 y.o. 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     amoxicillin-clavulanate (AUGMENTIN) 875-125 MG tablet Take 1 tablet by mouth 2 (two) times daily for 10 days. 14 tablet 0   carbidopa-levodopa (SINEMET IR) 25-100 MG tablet Take 1-2 tablets by mouth in the morning and at bedtime. 2 tabs qam, 1 tab qpm     ketoconazole (NIZORAL) 2 % shampoo Apply 1 application topically 2 (two) times a week. 120 mL 6   Multiple Vitamin (MULTIVITAMIN) tablet Take 1 tablet by mouth daily.     tadalafil (CIALIS) 5 MG tablet Take 1 tablet (5 mg total) by mouth daily as needed for erectile dysfunction. 90 tablet 0   tamsulosin (FLOMAX) 0.4 MG CAPS capsule TAKE 1 CAPSULE(0.4 MG) BY MOUTH EVERY MORNING 90 capsule 1   No current facility-administered medications for this visit.    Allergies: Patient has no known allergies.  Past Medical History:  Diagnosis Date   Anxiety    Back pain, chronic    Bursitis    left shoulder   Hyperlipidemia     Past Surgical History:  Procedure Laterality Date   ankle surgery     left ankle   APPENDECTOMY     HERNIA REPAIR      Family History  Problem Relation Age of Onset   Lymphoma Mother        2016 remission   Lung cancer Father     Social History   Tobacco Use   Smoking status: Never   Smokeless tobacco: Never  Substance Use Topics   Alcohol use: Yes    Comment: moderate    Subjective:  Seen at [facility] yesterday with concerns for sudden onset of lymph node swelling on right side of neck; diagnosed with tonsillar stone; throat culture is pending; has been gargling with warm salt water and actually feeling better today; does feel that  swelling has improved; no difficulty breathing or swallowing;      Objective:  Vitals:   01/30/21 0932  BP: 128/90  Pulse: 83  Resp: 16  Temp: 98.1 F (36.7 C)  SpO2: 98%  Weight: 219 lb (99.3 kg)  Height: '5\' 9"'$  (1.753 m)    General: Well developed, well nourished, in no acute distress  Skin : Warm and dry.  Head: Normocephalic and atraumatic  Eyes: Sclera and conjunctiva clear; pupils round and reactive to light; extraocular movements intact  Ears: External normal; canals clear; tympanic membranes normal  Oropharynx: Pink, supple. No suspicious lesions  Neck: Supple without thyromegaly, small right sided cervical lymph node noted;  Lungs: Respirations unlabored; clear to auscultation bilaterally without wheeze, rales, rhonchi  Neurologic: Alert and oriented; speech intact; face symmetrical; moves all extremities well; CNII-XII intact without focal deficit   Assessment:  1. Tonsil stone   2. Lymphadenopathy     Plan:  Joaquim Lai is not visible today; per patient, lymph node has gone down in size; continue to apply warm moist compresses; Rx for Augmentin to hold and fill only if needed;  This visit occurred during the SARS-CoV-2 public health emergency.  Safety protocols were in place, including screening questions prior to the  visit, additional usage of staff PPE, and extensive cleaning of exam room while observing appropriate contact time as indicated for disinfecting solutions.    No follow-ups on file.  No orders of the defined types were placed in this encounter.   Requested Prescriptions   Signed Prescriptions Disp Refills   amoxicillin-clavulanate (AUGMENTIN) 875-125 MG tablet 14 tablet 0    Sig: Take 1 tablet by mouth 2 (two) times daily for 10 days.

## 2021-05-17 ENCOUNTER — Encounter: Payer: Self-pay | Admitting: Family

## 2021-06-10 ENCOUNTER — Encounter: Payer: Self-pay | Admitting: Family

## 2021-06-10 ENCOUNTER — Ambulatory Visit (INDEPENDENT_AMBULATORY_CARE_PROVIDER_SITE_OTHER): Payer: 59 | Admitting: Family

## 2021-06-10 VITALS — BP 112/70 | HR 77 | Temp 98.0°F | Ht 69.0 in | Wt 224.2 lb

## 2021-06-10 DIAGNOSIS — Z13220 Encounter for screening for lipoid disorders: Secondary | ICD-10-CM | POA: Diagnosis not present

## 2021-06-10 DIAGNOSIS — Z0000 Encounter for general adult medical examination without abnormal findings: Secondary | ICD-10-CM | POA: Diagnosis not present

## 2021-06-10 DIAGNOSIS — Z125 Encounter for screening for malignant neoplasm of prostate: Secondary | ICD-10-CM

## 2021-06-10 LAB — CBC WITH DIFFERENTIAL/PLATELET
Basophils Absolute: 0 10*3/uL (ref 0.0–0.1)
Basophils Relative: 0.5 % (ref 0.0–3.0)
Eosinophils Absolute: 0.2 10*3/uL (ref 0.0–0.7)
Eosinophils Relative: 3.1 % (ref 0.0–5.0)
HCT: 46.9 % (ref 39.0–52.0)
Hemoglobin: 15.5 g/dL (ref 13.0–17.0)
Lymphocytes Relative: 28.4 % (ref 12.0–46.0)
Lymphs Abs: 1.7 10*3/uL (ref 0.7–4.0)
MCHC: 33.1 g/dL (ref 30.0–36.0)
MCV: 93.2 fl (ref 78.0–100.0)
Monocytes Absolute: 0.4 10*3/uL (ref 0.1–1.0)
Monocytes Relative: 6.8 % (ref 3.0–12.0)
Neutro Abs: 3.7 10*3/uL (ref 1.4–7.7)
Neutrophils Relative %: 61.2 % (ref 43.0–77.0)
Platelets: 139 10*3/uL — ABNORMAL LOW (ref 150.0–400.0)
RBC: 5.04 Mil/uL (ref 4.22–5.81)
RDW: 13.4 % (ref 11.5–15.5)
WBC: 6 10*3/uL (ref 4.0–10.5)

## 2021-06-10 LAB — COMPREHENSIVE METABOLIC PANEL
ALT: 13 U/L (ref 0–53)
AST: 18 U/L (ref 0–37)
Albumin: 4.4 g/dL (ref 3.5–5.2)
Alkaline Phosphatase: 42 U/L (ref 39–117)
BUN: 22 mg/dL (ref 6–23)
CO2: 28 mEq/L (ref 19–32)
Calcium: 9.5 mg/dL (ref 8.4–10.5)
Chloride: 103 mEq/L (ref 96–112)
Creatinine, Ser: 1.11 mg/dL (ref 0.40–1.50)
GFR: 70.88 mL/min (ref >60.00)
Glucose, Bld: 87 mg/dL (ref 70–99)
Potassium: 4 mEq/L (ref 3.5–5.1)
Sodium: 139 mEq/L (ref 135–145)
Total Bilirubin: 1 mg/dL (ref 0.2–1.2)
Total Protein: 6.7 g/dL (ref 6.0–8.3)

## 2021-06-10 LAB — LIPID PANEL
Cholesterol: 197 mg/dL (ref 0–200)
HDL: 42.9 mg/dL (ref >39.00)
LDL Cholesterol: 136 mg/dL — ABNORMAL HIGH (ref 0–99)
NonHDL: 154.12
Total CHOL/HDL Ratio: 5
Triglycerides: 93 mg/dL (ref 0.0–149.0)
VLDL: 18.6 mg/dL (ref 0.0–40.0)

## 2021-06-10 LAB — PSA: PSA: 0.86 ng/mL (ref 0.10–4.00)

## 2021-06-10 LAB — TSH: TSH: 1.1 u[IU]/mL (ref 0.35–5.50)

## 2021-06-10 MED ORDER — TAMSULOSIN HCL 0.4 MG PO CAPS: ORAL_CAPSULE | ORAL | 3 refills | Status: DC

## 2021-06-10 MED ORDER — TADALAFIL 5 MG PO TABS: 5.0000 mg | ORAL_TABLET | Freq: Every day | ORAL | 3 refills | Status: DC | PRN

## 2021-06-10 MED ORDER — KETOCONAZOLE 2 % EX SHAM: 1.0000 "application " | MEDICATED_SHAMPOO | CUTANEOUS | 6 refills | Status: DC

## 2021-06-10 NOTE — Patient Instructions (Signed)
Picture of knees is though to be livedo reticularis- a circulatory condition that make skin look "blotchy" Ask your neurologist how confident he is that this would be caused by Amantadine; I would recommend considering vascular consult if he agrees.

## 2021-06-10 NOTE — Progress Notes (Signed)
John Madden is a 65 y.o. 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     carbidopa-levodopa (SINEMET IR) 25-100 MG tablet Take 1-2 tablets by mouth in the morning and at bedtime. 2 tabs qam, 1 tab qpm     Multiple Vitamin (MULTIVITAMIN) tablet Take 1 tablet by mouth daily.     [START ON 06/12/2021] ketoconazole (NIZORAL) 2 % shampoo Apply 1 application topically 2 (two) times a week. 120 mL 6   tadalafil (CIALIS) 5 MG tablet Take 1 tablet (5 mg total) by mouth daily as needed for erectile dysfunction. 90 tablet 3   tamsulosin (FLOMAX) 0.4 MG CAPS capsule TAKE 1 CAPSULE(0.4 MG) BY MOUTH EVERY MORNING 90 capsule 3   No current facility-administered medications for this visit.    Allergies: Patient has no known allergies.  Past Medical History:  Diagnosis Date   Anxiety    Back pain, chronic    Bursitis    left shoulder   Hyperlipidemia     Past Surgical History:  Procedure Laterality Date   ankle surgery     left ankle   APPENDECTOMY     HERNIA REPAIR      Family History  Problem Relation Age of Onset   Lymphoma Mother        2016 remission   Lung cancer Father     Social History   Tobacco Use   Smoking status: Never   Smokeless tobacco: Never  Substance Use Topics   Alcohol use: Yes    Comment: moderate    Subjective:  Patient presents for yearly CPE; continuing to work with dentist and eye doctor; seeing neurology at [facility] for management of Parkinson's Disease;    Review of Systems  Constitutional: Negative.   HENT: Negative.    Eyes: Negative.   Respiratory: Negative.    Cardiovascular: Negative.   Gastrointestinal: Negative.   Genitourinary: Negative.    Musculoskeletal: Negative.   Skin: Negative.   Neurological:  Positive for tremors.  Endo/Heme/Allergies: Negative.   Psychiatric/Behavioral: Negative.        Objective:  Vitals:   06/10/21 0831  BP: 112/70  Pulse: 77  Temp: 98 F (36.7 C)  TempSrc: Oral  SpO2: 96%  Weight: 224 lb 3.2 oz (101.7 kg)  Height: _0  (1.753 m)    General: Well developed, well nourished, in no acute distress  Skin : Warm and dry.  Head: Normocephalic and atraumatic  Eyes: Sclera and conjunctiva clear; pupils round and reactive to light; extraocular movements intact  Ears: External normal; canals clear; tympanic membranes normal  Oropharynx: Pink, supple. No suspicious lesions  Neck: Supple without thyromegaly, adenopathy  Lungs: Respirations unlabored; clear to auscultation bilaterally without wheeze, rales, rhonchi  CVS exam: normal rate and regular rhythm.  Abdomen: Soft; nontender; nondistended; normoactive bowel sounds; no masses or hepatosplenomegaly  Musculoskeletal: No deformities; no active joint inflammation  Extremities: No edema, cyanosis, clubbing  Vessels: Symmetric bilaterally  Neurologic: Alert and oriented; speech intact; face symmetrical; moves all extremities well; CNII-XII intact without focal deficit; + tremors noted  Assessment:  1. PE (physical exam), annual   2. Lipid screening   3. Prostate cancer screening  Plan:   Age appropriate preventive healthcare needs addressed; encouraged regular eye doctor and dental exams; encouraged regular exercise; will update labs and refills as needed today; follow-up to be determined;  This visit occurred during the SARS-CoV-2 public health emergency.  Safety protocols were in place, including screening questions prior to the visit, additional usage of staff PPE, and extensive cleaning of exam room while observing appropriate contact time as indicated for disinfecting solutions.    No follow-ups on file.  Orders Placed This  Encounter  Procedures   CBC with Differential/Platelet   Comp Met (CMET)   Lipid panel   TSH   PSA    Requested Prescriptions   Signed Prescriptions Disp Refills   ketoconazole (NIZORAL) 2 % shampoo 120 mL 6    Sig: Apply 1 application topically 2 (two) times a week.   tamsulosin (FLOMAX) 0.4 MG CAPS capsule 90 capsule 3    Sig: TAKE 1 CAPSULE(0.4 MG) BY MOUTH EVERY MORNING   tadalafil (CIALIS) 5 MG tablet 90 tablet 3    Sig: Take 1 tablet (5 mg total) by mouth daily as needed for erectile dysfunction.

## 2021-06-12 ENCOUNTER — Other Ambulatory Visit: Payer: Self-pay | Admitting: Family

## 2021-06-12 DIAGNOSIS — R231 Pallor: Secondary | ICD-10-CM

## 2021-06-18 ENCOUNTER — Other Ambulatory Visit: Payer: Self-pay

## 2021-06-18 DIAGNOSIS — R231 Pallor: Secondary | ICD-10-CM

## 2021-06-30 NOTE — Progress Notes (Signed)
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  He has a normal peripheral vascular exam.  His noninvasive vascular testing is reassuring.  I counseled the patient that he has no evidence of peripheral arterial disease.  I encouraged him to maintain a healthy lifestyle and walk as much as he is able to.  He can follow-up with [place] as needed.  CHIEF COMPLAINT: Livedo reticularis  HISTORY OF PRESENT ILLNESS: John Madden is a 66 y.o. male who presents office for evaluation of possible peripheral arterial disease.  The patient is suffering from Parkinson's disease, and is started a new drug rate treatment with amantadine.  Since starting this therapy, he has noticed a lacy rash across his bilateral lower extremities.  The rashes not bothersome to him, blotchy, and does not raise the skin.  The patient has no symptoms of peripheral arterial disease.  His Parkinson's limits his speed of ambulation, but he reports no symptoms of claudication.  He has no rest pain symptoms.  He has no ulcers about his feet.  Past Medical History:  Diagnosis Date   Anxiety    Back pain, chronic    Bursitis    left shoulder   Hyperlipidemia   Parkinson's disease  Past Surgical History:  Procedure Laterality Date   ankle surgery     left ankle   APPENDECTOMY     HERNIA REPAIR      Family History  Problem Relation Age of Onset   Lymphoma Mother        2016 remission   Lung cancer Father     Social History   Socioeconomic History   Marital status: Single    Spouse name: Not on file   Number of children: Not on file   Years of education: Not on file   Highest education level: Not on file  Occupational History   Not on file  Tobacco Use   Smoking status: Never   Smokeless tobacco: Never  Substance and Sexual Activity   Alcohol use: Yes    Comment: moderate   Drug use: No    Sexual activity: Not on file  Other Topics Concern   Not on file  Social History Narrative   Lives home alone.  One grown child lives with mother in [place].  Works at [employer].  HS grad.  Caffeine one cup daily.    Social Determinants of Health   Financial Resource Strain: Not on file  Food Insecurity: Not on file  Transportation Needs: Not on file  Physical Activity: Not on file  Stress: Not on file  Social Connections: Not on file  Intimate Partner Violence: Not on file    No Known Allergies  Current Outpatient Medications  Medication Sig Dispense Refill   Amantadine HCl 100 MG tablet Take 150 mg by mouth in the morning, at noon, and at bedtime.     carbidopa-levodopa (SINEMET IR) 25-100 MG tablet Take 1-2 tablets by mouth in the morning and at bedtime. 2 tabs qam, 1 tab qpm     ketoconazole (NIZORAL) 2 % shampoo Apply 1 application topically 2 (two) times a week. 120 mL 6   Multiple Vitamin (MULTIVITAMIN) tablet Take 1 tablet by mouth daily.     tadalafil (CIALIS) 5 MG tablet Take 1 tablet (5 mg total) by mouth daily as needed for erectile dysfunction. 90 tablet 3   tamsulosin (FLOMAX) 0.4 MG  CAPS capsule TAKE 1 CAPSULE(0.4 MG) BY MOUTH EVERY MORNING 90 capsule 3   No current facility-administered medications for this visit.    REVIEW OF SYSTEMS:  [X]  denotes positive finding, [ ]  denotes negative finding Cardiac  Comments:  Chest pain or chest pressure:    Shortness of breath upon exertion:    Short of breath when lying flat:    Irregular heart rhythm:        Vascular    Pain in calf, thigh, or hip brought on by ambulation:    Pain in feet at night that wakes you up from your sleep:     Blood clot in your veins:    Leg swelling:         Pulmonary    Oxygen at home:    Productive cough:     Wheezing:         Neurologic    Sudden weakness in arms or legs:     Sudden numbness in arms or legs:     Sudden onset of difficulty speaking or slurred speech:     Temporary loss of vision in one eye:     Problems with dizziness:         Gastrointestinal    Blood in stool:     Vomited blood:         Genitourinary    Burning when urinating:     Blood in urine:        Psychiatric    Major depression:         Hematologic    Bleeding problems:    Problems with blood clotting too easily:        Skin    Rashes or ulcers:        Constitutional    Fever or chills:      PHYSICAL EXAM Vitals:   07/01/21 0914  BP: 131/87  Pulse: 78  Resp: 20  Temp: 98.6 F (37 C)  TempSrc: Temporal  SpO2: 95%  Weight: 220 lb 12.8 oz (100.2 kg)  Height: 5\' 9"  (1.753 m)    Constitutional: Well appearing.  No distress. Appears well nourished.  Neurologic: Masked facies.  Resting tremor right upper extremity. Psychiatric:  Mood and affect symmetric and appropriate. Eyes:  No icterus. No conjunctival pallor. Ears, nose, throat:  mucous membranes moist. Midline trachea.   Cardiac: Regular rate and rhythm.  Respiratory:  unlabored. Abdominal:  soft, non-tender, non-distended.  Peripheral vascular: 2+ dorsalis pedis pulses bilaterally.  Left anterior ankle scar consistent with orthopedic surgery. Extremity: No edema.  No cyanosis.  No pallor.  Skin: No gangrene.  No ulceration.  Lymphatic: No Stemmer's sign.  No ABI personally reviewed.  Triphasic waveforms at the ankles.  Ankle and tibial brachial index.  Palpable lymphadenopathy.  PERTINENT LABORATORY AND RADIOLOGIC DATA  Most recent CBC CBC Latest Ref Rng & Units 06/10/2021 01/17/2021 12/05/2020  WBC 4.0 - 10.5 K/uL 6.0 6.4 7.7  Hemoglobin 13.0 - 17.0 g/dL 15.5 16.1 16.6  Hematocrit 39.0 - 52.0 % 46.9 47.8 50.6  Platelets 150.0 - 400.0 K/uL 139.0(L) 145.0(L) 157     Most recent CMP CMP Latest Ref Rng & Units 06/10/2021 12/05/2020 07/29/2020  Glucose 70 - 99 mg/dL 87 107(H) 79  BUN 6 - 23 mg/dL 22 26(H) 22  Creatinine 0.40 - 1.50 mg/dL 1.11 1.11 1.07  Sodium 135 - 145 mEq/L 139 138 141  Potassium  3.5 - 5.1 mEq/L 4.0 5.1 3.9  Chloride 96 - 112 mEq/L  103 106 105  CO2 19 - 32 mEq/L 28 24 30   Calcium 8.4 - 10.5 mg/dL 9.5 9.3 9.5  Total Protein 6.0 - 8.3 g/dL 6.7 6.3(L) 7.3  Total Bilirubin 0.2 - 1.2 mg/dL 1.0 0.8 0.6  Alkaline Phos 39 - 117 U/L 42 42 43  AST 0 - 37 U/L 18 35 15  ALT 0 - 53 U/L 13 12 7     Renal function CrCl cannot be calculated (Unknown ideal weight.).  No results found for: HGBA1C  LDL Cholesterol (Calc)  Date Value Ref Range Status  01/01/2020 111 (H) mg/dL (calc) Final    Comment:    Reference range: <100 . Desirable range <100 mg/dL for primary prevention;   <70 mg/dL for patients with CHD or diabetic patients  with > or = 2 CHD risk factors. Marland Kitchen LDL-C is now calculated using the Martin-Hopkins  calculation, which is a validated novel method providing  better accuracy than the Friedewald equation in the  estimation of LDL-C.  Cresenciano Genre et al. Annamaria Helling. 0086;761(95): 2061-2068  (http://education.[facility].com/faq/FAQ164)    LDL Cholesterol  Date Value Ref Range Status  06/10/2021 136 (H) 0 - 99 mg/dL Final     Vascular Imaging: Ankle-brachial index 07/01/2021.  Personally reviewed.  Triphasic waveforms at the ankle.  Normal toe brachial index.  Reassuring exam.  Yevonne Aline. Stanford Breed, MD [facility] Phone Number: [phone] 06/30/2021 8:02 PM  Total time spent on preparing this encounter including chart review, data review, collecting history, examining the patient, coordinating care for this new patient, 45 minutes.  Portions of this report may have been transcribed using voice recognition software.  Every effort has been made to ensure accuracy; however, inadvertent computerized transcription errors may still be present.

## 2021-07-01 ENCOUNTER — Other Ambulatory Visit: Payer: Self-pay

## 2021-07-01 ENCOUNTER — Ambulatory Visit: Payer: 59 | Admitting: Vascular Surgery

## 2021-07-01 ENCOUNTER — Ambulatory Visit (HOSPITAL_COMMUNITY)
Admission: RE | Admit: 2021-07-01 | Discharge: 2021-07-01 | Disposition: A | Discharge status: Home / Self Care | Payer: 59 | Source: Ambulatory Visit | Attending: Vascular Surgery | Admitting: Vascular Surgery

## 2021-07-01 ENCOUNTER — Encounter: Payer: Self-pay | Admitting: Vascular Surgery

## 2021-07-01 VITALS — BP 131/87 | HR 78 | Temp 98.6°F | Resp 20 | Ht 69.0 in | Wt 220.8 lb

## 2021-07-01 DIAGNOSIS — R231 Pallor: Secondary | ICD-10-CM | POA: Diagnosis present

## 2021-07-01 DIAGNOSIS — M25572 Pain in left ankle and joints of left foot: Secondary | ICD-10-CM | POA: Insufficient documentation

## 2021-08-24 ENCOUNTER — Encounter: Payer: Self-pay | Admitting: Family

## 2021-11-17 ENCOUNTER — Other Ambulatory Visit (HOSPITAL_COMMUNITY): Payer: Self-pay | Admitting: Orthopedic Surgery

## 2021-11-18 ENCOUNTER — Encounter: Payer: Self-pay | Admitting: Family

## 2021-11-28 ENCOUNTER — Encounter: Payer: Self-pay | Admitting: Family

## 2021-11-28 ENCOUNTER — Ambulatory Visit: Payer: 59 | Admitting: Family

## 2021-11-28 VITALS — BP 132/84 | HR 75 | Resp 20 | Ht 69.0 in | Wt 225.0 lb

## 2021-11-28 DIAGNOSIS — K625 Hemorrhage of anus and rectum: Secondary | ICD-10-CM | POA: Diagnosis not present

## 2021-11-28 DIAGNOSIS — N400 Enlarged prostate without lower urinary tract symptoms: Secondary | ICD-10-CM | POA: Diagnosis not present

## 2021-11-28 DIAGNOSIS — E7800 Pure hypercholesterolemia, unspecified: Secondary | ICD-10-CM | POA: Diagnosis not present

## 2021-11-28 LAB — LIPID PANEL
Cholesterol: 169 mg/dL (ref 0–200)
HDL: 46.9 mg/dL (ref >39.00)
LDL Cholesterol: 112 mg/dL — ABNORMAL HIGH (ref 0–99)
NonHDL: 122.59
Total CHOL/HDL Ratio: 4
Triglycerides: 55 mg/dL (ref 0.0–149.0)
VLDL: 11 mg/dL (ref 0.0–40.0)

## 2021-11-28 MED ORDER — MUPIROCIN 2 % EX OINT: 1.0000 | TOPICAL_OINTMENT | Freq: Two times a day (BID) | CUTANEOUS | 0 refills | Status: DC

## 2021-11-28 NOTE — Progress Notes (Signed)
John Madden is a 66 y.o. 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     carbidopa-levodopa (SINEMET IR) 25-100 MG tablet Take 1-2 tablets by mouth in the morning and at bedtime. 2 tabs qam, 1 tab qpm     ketoconazole (NIZORAL) 2 % shampoo Apply 1 application topically 2 (two) times a week. 120 mL 6   Multiple Vitamin (MULTIVITAMIN) tablet Take 1 tablet by mouth daily.     mupirocin ointment (BACTROBAN) 2 % Apply 1 Application topically 2 (two) times daily. 22 g 0   tadalafil (CIALIS) 5 MG tablet Take 1 tablet (5 mg total) by mouth daily as needed for erectile dysfunction. 90 tablet 3   tamsulosin (FLOMAX) 0.4 MG CAPS capsule TAKE 1 CAPSULE(0.4 MG) BY MOUTH EVERY MORNING 90 capsule 3   No current facility-administered medications for this visit.    Allergies: Patient has no known allergies.  Past Medical History:  Diagnosis Date   Anxiety    Back pain, chronic    Bursitis    left shoulder   Hyperlipidemia     Past Surgical History:  Procedure Laterality Date   ankle surgery     left ankle   APPENDECTOMY     HERNIA REPAIR      Family History  Problem Relation Age of Onset   Lymphoma Mother        2016 remission   Lung cancer Father     Social History   Tobacco Use   Smoking status: Never   Smokeless tobacco: Never  Substance Use Topics   Alcohol use: Yes    Comment: moderate    Subjective:  Patient presents for 6 month follow up on lipid panel; still prefers not to take statin at  this time; will be having foot surgery at the end of the month and hopeful that will be able to start exercising more regularly soon; Requesting referral to urology/ gastroenterology;  Wonders about options for increasing dosage of Flomax;     Objective:  Vitals:   11/28/21 0902  BP: 132/84  Pulse: 75  Resp: 20  SpO2: 97%  Weight: 225 lb (102.1 kg)  Height: '5\' 9"'$  (1.753 m)    General: Well developed, well nourished, in no acute distress  Skin : Warm and dry.  Head: Normocephalic and atraumatic  Lungs: Respirations unlabored;  Neurologic: Alert and oriented; speech intact; face symmetrical; moves all extremities well; CNII-XII intact without focal deficit   Assessment:  1. Elevated LDL cholesterol level   2. Rectal bleeding   3. Benign prostatic hyperplasia, unspecified whether lower urinary tract symptoms present     Plan:  Update lipid panel today; Refer back to GI- referral was done in 2022 but patient was unable to keep that appointment; Okay to try increasing dosage of Tamsulosin to 0.4 mg bid or 0.8 mg qd; he will call back with with option he prefers and will update RX; referral to urology as requested.   No follow-ups on file.  Orders  Placed This Encounter  Procedures   Lipid panel   Ambulatory referral to Gastroenterology    Referral Priority:   Routine    Referral Type:   Consultation    Referral Reason:   Specialty Services Required    Number of Visits Requested:   1   Ambulatory referral to Urology    Referral Priority:   Routine    Referral Type:   Consultation    Referral Reason:   Specialty Services Required    Requested Specialty:   Urology    Number of Visits Requested:   1    Requested Prescriptions   Signed Prescriptions Disp Refills   mupirocin ointment (BACTROBAN) 2 % 22 g 0    Sig: Apply 1 Application topically 2 (two) times daily.

## 2021-12-03 ENCOUNTER — Encounter (HOSPITAL_BASED_OUTPATIENT_CLINIC_OR_DEPARTMENT_OTHER): Payer: Self-pay | Admitting: Orthopedic Surgery

## 2021-12-03 ENCOUNTER — Other Ambulatory Visit: Payer: Self-pay

## 2021-12-11 ENCOUNTER — Other Ambulatory Visit: Payer: Self-pay

## 2021-12-11 ENCOUNTER — Ambulatory Visit (HOSPITAL_BASED_OUTPATIENT_CLINIC_OR_DEPARTMENT_OTHER): Payer: 59 | Admitting: Anesthesiology

## 2021-12-11 ENCOUNTER — Encounter (HOSPITAL_BASED_OUTPATIENT_CLINIC_OR_DEPARTMENT_OTHER)
Admission: RE | Disposition: A | Discharge status: Home / Self Care | Payer: Self-pay | Source: Ambulatory Visit | Attending: Orthopedic Surgery

## 2021-12-11 ENCOUNTER — Ambulatory Visit (HOSPITAL_BASED_OUTPATIENT_CLINIC_OR_DEPARTMENT_OTHER): Payer: 59

## 2021-12-11 ENCOUNTER — Encounter (HOSPITAL_BASED_OUTPATIENT_CLINIC_OR_DEPARTMENT_OTHER): Payer: Self-pay | Admitting: Orthopedic Surgery

## 2021-12-11 ENCOUNTER — Ambulatory Visit (HOSPITAL_BASED_OUTPATIENT_CLINIC_OR_DEPARTMENT_OTHER)
Admission: RE | Admit: 2021-12-11 | Discharge: 2021-12-11 | Disposition: A | Discharge status: Home / Self Care | Payer: 59 | Source: Ambulatory Visit | Attending: Orthopedic Surgery | Admitting: Orthopedic Surgery

## 2021-12-11 DIAGNOSIS — F419 Anxiety disorder, unspecified: Secondary | ICD-10-CM | POA: Diagnosis not present

## 2021-12-11 DIAGNOSIS — M2042 Other hammer toe(s) (acquired), left foot: Secondary | ICD-10-CM | POA: Diagnosis present

## 2021-12-11 DIAGNOSIS — E785 Hyperlipidemia, unspecified: Secondary | ICD-10-CM | POA: Diagnosis not present

## 2021-12-11 DIAGNOSIS — M7742 Metatarsalgia, left foot: Secondary | ICD-10-CM | POA: Diagnosis not present

## 2021-12-11 DIAGNOSIS — G20 Parkinson's disease: Secondary | ICD-10-CM | POA: Diagnosis not present

## 2021-12-11 DIAGNOSIS — Z01818 Encounter for other preprocedural examination: Secondary | ICD-10-CM

## 2021-12-11 HISTORY — PX: WEIL OSTEOTOMY: SHX5044

## 2021-12-11 HISTORY — PX: HAMMER TOE SURGERY: SHX385

## 2021-12-11 HISTORY — DX: Parkinson's disease: G20

## 2021-12-11 HISTORY — DX: Parkinson's disease without dyskinesia, without mention of fluctuations: G20.A1

## 2021-12-11 SURGERY — OSTEOTOMY, WEIL
Anesthesia: General | Site: Toe | Laterality: Left

## 2021-12-11 MED ORDER — 0.9 % SODIUM CHLORIDE (POUR BTL) OPTIME
TOPICAL | Status: DC | PRN
  Administered 2021-12-11: 300 mL

## 2021-12-11 MED ORDER — VANCOMYCIN HCL 500 MG IV SOLR
INTRAVENOUS | Status: DC | PRN
  Administered 2021-12-11: 500 mg

## 2021-12-11 MED ORDER — LIDOCAINE 2% (20 MG/ML) 5 ML SYRINGE
INTRAMUSCULAR | Status: DC | PRN
  Administered 2021-12-11: 60 mg via INTRAVENOUS

## 2021-12-11 MED ORDER — MEPERIDINE HCL 25 MG/ML IJ SOLN: 6.2500 mg | INTRAMUSCULAR | Status: DC | PRN

## 2021-12-11 MED ORDER — FENTANYL CITRATE (PF) 100 MCG/2ML IJ SOLN
INTRAMUSCULAR | Status: AC
  Filled 2021-12-11: qty 2

## 2021-12-11 MED ORDER — ACETAMINOPHEN 325 MG PO TABS: 325.0000 mg | ORAL_TABLET | ORAL | Status: DC | PRN

## 2021-12-11 MED ORDER — MIDAZOLAM HCL 2 MG/2ML IJ SOLN: 2.0000 mg | Freq: Once | INTRAMUSCULAR | Status: DC

## 2021-12-11 MED ORDER — OXYCODONE HCL 5 MG PO TABS: 5.0000 mg | ORAL_TABLET | Freq: Once | ORAL | Status: DC | PRN

## 2021-12-11 MED ORDER — FENTANYL CITRATE (PF) 100 MCG/2ML IJ SOLN: 25.0000 ug | INTRAMUSCULAR | Status: DC | PRN

## 2021-12-11 MED ORDER — PROPOFOL 10 MG/ML IV BOLUS
INTRAVENOUS | Status: DC | PRN
  Administered 2021-12-11: 150 mg via INTRAVENOUS
  Administered 2021-12-11: 50 mg via INTRAVENOUS

## 2021-12-11 MED ORDER — OXYCODONE HCL 5 MG PO TABS: 5.0000 mg | ORAL_TABLET | Freq: Four times a day (QID) | ORAL | 0 refills | Status: AC | PRN

## 2021-12-11 MED ORDER — ACETAMINOPHEN 160 MG/5ML PO SOLN: 325.0000 mg | ORAL | Status: DC | PRN

## 2021-12-11 MED ORDER — CEFAZOLIN SODIUM-DEXTROSE 2-4 GM/100ML-% IV SOLN
2.0000 g | INTRAVENOUS | Status: AC
  Administered 2021-12-11: 2 g via INTRAVENOUS

## 2021-12-11 MED ORDER — ONDANSETRON HCL 4 MG/2ML IJ SOLN
INTRAMUSCULAR | Status: DC | PRN
  Administered 2021-12-11: 4 mg via INTRAVENOUS

## 2021-12-11 MED ORDER — FENTANYL CITRATE (PF) 100 MCG/2ML IJ SOLN
100.0000 ug | Freq: Once | INTRAMUSCULAR | Status: AC
  Administered 2021-12-11: 25 ug via INTRAVENOUS

## 2021-12-11 MED ORDER — BUPIVACAINE-EPINEPHRINE (PF) 0.5% -1:200000 IJ SOLN
INTRAMUSCULAR | Status: DC | PRN
  Administered 2021-12-11: 10 mL

## 2021-12-11 MED ORDER — BUPIVACAINE LIPOSOME 1.3 % IJ SUSP
INTRAMUSCULAR | Status: DC | PRN
  Administered 2021-12-11: 10 mL via PERINEURAL

## 2021-12-11 MED ORDER — ONDANSETRON HCL 4 MG/2ML IJ SOLN: 4.0000 mg | Freq: Once | INTRAMUSCULAR | Status: DC | PRN

## 2021-12-11 MED ORDER — CEFAZOLIN SODIUM-DEXTROSE 2-4 GM/100ML-% IV SOLN
INTRAVENOUS | Status: AC
  Filled 2021-12-11: qty 100

## 2021-12-11 MED ORDER — EPHEDRINE SULFATE-NACL 50-0.9 MG/10ML-% IV SOSY
PREFILLED_SYRINGE | INTRAVENOUS | Status: DC | PRN
  Administered 2021-12-11: 15 mg via INTRAVENOUS

## 2021-12-11 MED ORDER — OXYCODONE HCL 5 MG/5ML PO SOLN: 5.0000 mg | Freq: Once | ORAL | Status: DC | PRN

## 2021-12-11 MED ORDER — LACTATED RINGERS IV SOLN: INTRAVENOUS | Status: DC

## 2021-12-11 MED ORDER — MIDAZOLAM HCL 2 MG/2ML IJ SOLN
INTRAMUSCULAR | Status: AC
  Filled 2021-12-11: qty 2

## 2021-12-11 MED ORDER — SODIUM CHLORIDE 0.9 % IV SOLN: INTRAVENOUS | Status: DC

## 2021-12-11 SURGICAL SUPPLY — 75 items
APL PRP STRL LF DISP 70% ISPRP (MISCELLANEOUS) ×2
BANDAGE ESMARK 6X9 LF (GAUZE/BANDAGES/DRESSINGS) ×2 IMPLANT
BIT DRILL CANN 2.4 (BIT) ×3
BIT DRILL CANN MAX VPC 2.4 (BIT) IMPLANT
BLADE AVERAGE 25X9 (BLADE) IMPLANT
BLADE LONG MED 25X9 (BLADE) ×3 IMPLANT
BLADE OSC/SAG .038X5.5 CUT EDG (BLADE) IMPLANT
BLADE SURG 15 STRL LF DISP TIS (BLADE) ×4 IMPLANT
BLADE SURG 15 STRL SS (BLADE) ×6
BNDG CMPR 75X21 PLY HI ABS (MISCELLANEOUS)
BNDG CMPR 9X4 STRL LF SNTH (GAUZE/BANDAGES/DRESSINGS)
BNDG CMPR 9X6 STRL LF SNTH (GAUZE/BANDAGES/DRESSINGS) ×2
BNDG ELASTIC 4X5.8 VLCR STR LF (GAUZE/BANDAGES/DRESSINGS) ×3 IMPLANT
BNDG ESMARK 4X9 LF (GAUZE/BANDAGES/DRESSINGS) IMPLANT
BNDG ESMARK 6X9 LF (GAUZE/BANDAGES/DRESSINGS) ×3
BNDG GZE 12X3 1 PLY HI ABS (GAUZE/BANDAGES/DRESSINGS) ×2
BNDG STRETCH GAUZE 3IN X12FT (GAUZE/BANDAGES/DRESSINGS) ×3 IMPLANT
CHLORAPREP W/TINT 26 (MISCELLANEOUS) ×3 IMPLANT
COVER BACK TABLE 60X90IN (DRAPES) ×3 IMPLANT
CUFF TOURN SGL QUICK 24 (TOURNIQUET CUFF)
CUFF TOURN SGL QUICK 34 (TOURNIQUET CUFF)
CUFF TRNQT CYL 24X4X16.5-23 (TOURNIQUET CUFF) IMPLANT
CUFF TRNQT CYL 34X4.125X (TOURNIQUET CUFF) IMPLANT
DRAPE EXTREMITY T 121X128X90 (DISPOSABLE) ×3 IMPLANT
DRAPE OEC MINIVIEW 54X84 (DRAPES) ×3 IMPLANT
DRAPE U-SHAPE 47X51 STRL (DRAPES) ×3 IMPLANT
DRSG MEPITEL 4X7.2 (GAUZE/BANDAGES/DRESSINGS) ×3 IMPLANT
DRSG PAD ABDOMINAL 8X10 ST (GAUZE/BANDAGES/DRESSINGS) IMPLANT
ELECT REM PT RETURN 9FT ADLT (ELECTROSURGICAL) ×3
ELECTRODE REM PT RTRN 9FT ADLT (ELECTROSURGICAL) ×2 IMPLANT
GAUZE SPONGE 4X4 12PLY STRL (GAUZE/BANDAGES/DRESSINGS) ×3 IMPLANT
GAUZE STRETCH 2X75IN STRL (MISCELLANEOUS) IMPLANT
GLOVE BIO SURGEON STRL SZ8 (GLOVE) ×3 IMPLANT
GLOVE BIOGEL PI IND STRL 8 (GLOVE) ×4 IMPLANT
GLOVE BIOGEL PI INDICATOR 8 (GLOVE) ×2
GLOVE ECLIPSE 8.0 STRL XLNG CF (GLOVE) ×3 IMPLANT
GOWN STRL REUS W/ TWL LRG LVL3 (GOWN DISPOSABLE) ×2 IMPLANT
GOWN STRL REUS W/ TWL XL LVL3 (GOWN DISPOSABLE) ×4 IMPLANT
GOWN STRL REUS W/TWL LRG LVL3 (GOWN DISPOSABLE) ×3
GOWN STRL REUS W/TWL XL LVL3 (GOWN DISPOSABLE) ×6
K-WIRE COCR 1.1X105 (WIRE) ×6
K-WIRE DBL .054X4 NSTRL (WIRE)
KWIRE COCR 1.1X105 (WIRE) IMPLANT
KWIRE DBL .054X4 NSTRL (WIRE) IMPLANT
NDL HYPO 25X1 1.5 SAFETY (NEEDLE) IMPLANT
NEEDLE HYPO 22GX1.5 SAFETY (NEEDLE) IMPLANT
NEEDLE HYPO 25X1 1.5 SAFETY (NEEDLE) IMPLANT
NS IRRIG 1000ML POUR BTL (IV SOLUTION) ×3 IMPLANT
PACK BASIN DAY SURGERY FS (CUSTOM PROCEDURE TRAY) ×3 IMPLANT
PAD CAST 4YDX4 CTTN HI CHSV (CAST SUPPLIES) ×2 IMPLANT
PADDING CAST ABS 4INX4YD NS (CAST SUPPLIES)
PADDING CAST ABS COTTON 4X4 ST (CAST SUPPLIES) IMPLANT
PADDING CAST COTTON 4X4 STRL (CAST SUPPLIES) ×3
PENCIL SMOKE EVACUATOR (MISCELLANEOUS) ×3 IMPLANT
SANITIZER HAND PURELL 535ML FO (MISCELLANEOUS) ×3 IMPLANT
SCREW HCS TWIST-OFF 2.0X12MM (Screw) ×2 IMPLANT
SCREW VPC 3.4X16 (Screw) ×2 IMPLANT
SHEET MEDIUM DRAPE 40X70 STRL (DRAPES) ×3 IMPLANT
SLEEVE SCD COMPRESS KNEE MED (STOCKING) ×3 IMPLANT
SPONGE T-LAP 18X18 ~~LOC~~+RFID (SPONGE) ×3 IMPLANT
STOCKINETTE 6  STRL (DRAPES) ×1
STOCKINETTE 6 STRL (DRAPES) ×2 IMPLANT
SUCTION FRAZIER HANDLE 10FR (MISCELLANEOUS)
SUCTION TUBE FRAZIER 10FR DISP (MISCELLANEOUS) IMPLANT
SUT ETHILON 3 0 PS 1 (SUTURE) ×3 IMPLANT
SUT MNCRL AB 3-0 PS2 18 (SUTURE) ×3 IMPLANT
SUT VIC AB 2-0 SH 27 (SUTURE)
SUT VIC AB 2-0 SH 27XBRD (SUTURE) IMPLANT
SUT VICRYL 0 UR6 27IN ABS (SUTURE) IMPLANT
SYR BULB EAR ULCER 3OZ GRN STR (SYRINGE) ×3 IMPLANT
SYR CONTROL 10ML LL (SYRINGE) IMPLANT
TOWEL GREEN STERILE FF (TOWEL DISPOSABLE) ×3 IMPLANT
TUBE CONNECTING 20X1/4 (TUBING) IMPLANT
UNDERPAD 30X36 HEAVY ABSORB (UNDERPADS AND DIAPERS) ×3 IMPLANT
YANKAUER SUCT BULB TIP NO VENT (SUCTIONS) IMPLANT

## 2021-12-11 NOTE — Transfer of Care (Signed)
Immediate Anesthesia Transfer of Care Note  Patient: Doren Kaspar  Procedure(s) Performed: 2nd and 3rd WEIL OSTEOTOMY (Left) 2nd and 3rd HAMMER TOE CORRECTION (Left: Toe)  Patient Location: PACU  Anesthesia Type:GA combined with regional for post-op pain  Level of Consciousness: sedated  Airway & Oxygen Therapy: Patient Spontanous Breathing and Patient connected to face mask oxygen  Post-op Assessment: Report given to RN and Post -op Vital signs reviewed and stable  Post vital signs: Reviewed and stable  Last Vitals:  Vitals Value Taken Time  BP 118/81 12/11/21 1251  Temp    Pulse 86 12/11/21 1252  Resp 21 12/11/21 1252  SpO2 99 % 12/11/21 1252  Vitals shown include unvalidated device data.  Last Pain:  Vitals:   12/11/21 0923  TempSrc: Oral  PainSc: 1       Patients Stated Pain Goal: 1 (61/68/37 2902)  Complications: No notable events documented.

## 2021-12-11 NOTE — H&P (Signed)
John Madden is an 66 y.o. male.   Chief Complaint: Left foot pain HPI: 66 year old male complains of a long history of left forefoot pain due to second and third hammertoe deformities.  He has failed nonoperative treatment including activity modification, John anti-inflammatories and shoewear modification.  He presents today for surgical correction of the left second and third toe deformities.  Past Medical History:  Diagnosis Date   Anxiety    Back pain, chronic    Bursitis    left shoulder   Hyperlipidemia    Parkinson disease (HCC)     Past Surgical History:  Procedure Laterality Date   ankle surgery     left ankle   APPENDECTOMY     HERNIA REPAIR      Family History  Problem Relation Age of Onset   Lymphoma Mother        2016 remission   Lung cancer Father    Social History:  reports that he has never smoked. He has never used smokeless tobacco. He reports current alcohol use. He reports that he does not use drugs.  Allergies: No Known Allergies  Medications Prior to Admission  Medication Sig Dispense Refill   Amantadine HCl 100 MG tablet Take 150 mg by mouth in the morning, at noon, and at bedtime.     carbidopa-levodopa (SINEMET IR) 25-100 MG tablet Take 1-2 tablets by mouth in the morning and at bedtime. 2 tabs qam, 1 tab qpm     ketoconazole (NIZORAL) 2 % shampoo Apply 1 application topically 2 (two) times a week. 120 mL 6   Multiple Vitamin (MULTIVITAMIN) tablet Take 1 tablet by mouth daily.     tadalafil (CIALIS) 5 MG tablet Take 1 tablet (5 mg total) by mouth daily as needed for erectile dysfunction. 90 tablet 3   tamsulosin (FLOMAX) 0.4 MG CAPS capsule TAKE 1 CAPSULE(0.4 MG) BY MOUTH EVERY MORNING 90 capsule 3    No results found for this or any previous visit (from the past 48 hour(s)). DG MINI C-ARM IMAGE ONLY  Result Date: Dec 14, 2021 There is no interpretation for this exam.  This order is for images obtained during a surgical procedure.  Please See  "Surgeries" Tab for more information regarding the procedure.    Review of Systems no recent fever, chills, nausea, vomiting or changes in his appetite  Blood pressure (!) 126/94, pulse 82, temperature 97.8 F (36.6 C), temperature source John, resp. rate 16, height '5\' 9"'$  (1.753 m), weight 101.3 kg, SpO2 95 %. Physical Exam  Well-nourished well-developed man in no apparent distress.  Alert and oriented x4.  Normal mood and affect.  Normal gait.  The left second and third toes are noted to be in a hammertoe position.  They are not passively correctable.  Skin is healthy and intact.  Pulses are palpable.  No lymphadenopathy.  Intact sensibility to light touch dorsally and plantarly at the forefoot.   Assessment/Plan Left second and third hammertoe deformities -to the operating room today for second and third metatarsal Weil osteotomies and hammertoe corrections.  The risks and benefits of the alternative treatment options have been discussed in detail.  The patient wishes to proceed with surgery and specifically understands risks of bleeding, infection, nerve damage, blood clots, need for additional surgery, amputation and death.   Wylene Simmer, MD 2021/12/14, 11:44 AM

## 2021-12-11 NOTE — Discharge Instructions (Addendum)
John Simmer, MD [facility]  Please read the following information regarding your care after surgery.  Medications  You only need a prescription for the narcotic pain medicine (ex. oxycodone, Percocet, Norco).  All of the other medicines listed below are available over the counter. X Aleve 2 pills twice a day for the first 3 days after surgery. X acetominophen (Tylenol) 650 mg every 4-6 hours as you need for minor to moderate pain X oxycodone as prescribed for severe pain  Narcotic pain medicine (ex. oxycodone, Percocet, Vicodin) will cause constipation.  To prevent this problem, take the following medicines while you are taking any pain medicine. X docusate sodium (Colace) 100 mg twice a day X senna (Senokot) 2 tablets twice a day  Weight Bearing X Bear weight only on your operated foot in the post-op shoe.  Cast / Splint / Dressing X Keep your splint, cast or dressing clean and dry.  Don't put anything (coat hanger, pencil, etc) down inside of it.  If it gets damp, use a hair dryer on the cool setting to dry it.  If it gets soaked, call the office to schedule an appointment for a cast change.  After your dressing, cast or splint is removed; you may shower, but do not soak or scrub the wound.  Allow the water to run over it, and then gently pat it dry.  Swelling It is normal for you to have swelling where you had surgery.  To reduce swelling and pain, keep your toes above your nose for at least 3 days after surgery.  It may be necessary to keep your foot or leg elevated for several weeks.  If it hurts, it should be elevated.  Follow Up Call my office at [phone] when you are discharged from the hospital or surgery center to schedule an appointment to be seen two weeks after surgery.  Call my office at [phone] if you develop a fever >101.5 F, nausea, vomiting, bleeding from the surgical site or severe pain.    Post Anesthesia Home Care Instructions  Activity: Get plenty of  rest for the remainder of the day. A responsible individual must stay with you for 24 hours following the procedure.  For the next 24 hours, DO NOT: -Drive a car -Paediatric nurse -Drink alcoholic beverages -Take any medication unless instructed by your physician -Make any legal decisions or sign important papers.  Meals: Start with liquid foods such as gelatin or soup. Progress to regular foods as tolerated. Avoid greasy, spicy, heavy foods. If nausea and/or vomiting occur, drink only clear liquids until the nausea and/or vomiting subsides. Call your physician if vomiting continues.  Special Instructions/Symptoms: Your throat may feel dry or sore from the anesthesia or the breathing tube placed in your throat during surgery. If this causes discomfort, gargle with warm salt water. The discomfort should disappear within 24 hours.  Regional Anesthesia Blocks  1. Numbness or the inability to move the "blocked" extremity may last from 3-48 hours after placement. The length of time depends on the medication injected and your individual response to the medication. If the numbness is not going away after 48 hours, call your surgeon.  2. The extremity that is blocked will need to be protected until the numbness is gone and the  Strength has returned. Because you cannot feel it, you will need to take extra care to avoid injury. Because it may be weak, you may have difficulty moving it or using it. You may not know what position  it is in without looking at it while the block is in effect.  3. For blocks in the legs and feet, returning to weight bearing and walking needs to be done carefully. You will need to wait until the numbness is entirely gone and the strength has returned. You should be able to move your leg and foot normally before you try and bear weight or walk. You will need someone to be with you when you first try to ensure you do not fall and possibly risk injury.  4. Bruising and  tenderness at the needle site are common side effects and will resolve in a few days.  5. Persistent numbness or new problems with movement should be communicated to the surgeon or the [place] [phone] [place] [phone]).   Information for Discharge Teaching: EXPAREL (bupivacaine liposome injectable suspension)   Your surgeon or anesthesiologist gave you EXPAREL(bupivacaine) to help control your pain after surgery.  EXPAREL is a local anesthetic that provides pain relief by numbing the tissue around the surgical site. EXPAREL is designed to release pain medication over time and can control pain for up to 72 hours. Depending on how you respond to EXPAREL, you may require less pain medication during your recovery.  Possible side effects: Temporary loss of sensation or ability to move in the area where bupivacaine was injected. Nausea, vomiting, constipation Rarely, numbness and tingling in your mouth or lips, lightheadedness, or anxiety may occur. Call your doctor right away if you think you may be experiencing any of these sensations, or if you have other questions regarding possible side effects.  Follow all other discharge instructions given to you by your surgeon or nurse. Eat a healthy diet and drink plenty of water or other fluids.  If you return to the hospital for any reason within 96 hours following the administration of EXPAREL, it is important for health care providers to know that you have received this anesthetic. A teal colored band has been placed on your arm with the date, time and amount of EXPAREL you have received in order to alert and inform your health care providers. Please leave this armband in place for the full 96 hours following administration, and then you may remove the band.

## 2021-12-11 NOTE — Op Note (Signed)
12/11/2021  1:03 PM  PATIENT:  John Madden  66 y.o. male  PRE-OPERATIVE DIAGNOSIS:   1.  Left forefoot metatarsalgia      2.  Left 2nd and 3rd hammertoe deformities.  POST-OPERATIVE DIAGNOSIS: Same  Procedure(s):  Left foot 2nd and 3rd WEIL OSTEOTOMY Left 2nd and 3rd FDL percutaneous tenotomies Left 2nd and 3rd HAMMER TOE CORRECTION Left foot AP, lateral and oblique radiographs  SURGEON:  Wylene Simmer, MD  ASSISTANT: none  ANESTHESIA:   General, regional  EBL:  minimal   TOURNIQUET:   Total Tourniquet Time Documented: Thigh (Left) - 29 minutes Total: Thigh (Left) - 29 minutes  COMPLICATIONS:  None apparent  DISPOSITION:  Extubated, awake and stable to recovery.  INDICATION FOR PROCEDURE: 67 year old male without significant past medical history complains of pain at his left second and third toes.  He gets recurrent calluses at the tips of the toes due to rigid hammertoe deformities.  He has failed nonoperative treatment including activity modification, oral anti-inflammatories and shoewear modification.  He presents today for surgical correction of these painful left forefoot deformities.  The risks and benefits of the alternative treatment options have been discussed in detail.  The patient wishes to proceed with surgery and specifically understands risks of bleeding, infection, nerve damage, blood clots, need for additional surgery, amputation and death.    PROCEDURE IN DETAIL:  After pre operative consent was obtained, and the correct operative site was identified, the patient was brought to the operating room and placed supine on the OR table.  Anesthesia was administered.  Pre-operative antibiotics were administered.  A surgical timeout was taken.  The left lower extremity was prepped and draped in standard sterile fashion with a tourniquet around the thigh.  The extremity was elevated and the tourniquet was inflated to 250 mmHg.  The flexor digitorum longus tendons were  tight to both the second and third toes.  A Beaver blade was used to release the FDL from its insertion through the percutaneous approach at the distal flexion crease.  The same procedure was then performed for the third toe.  A longitudinal incision was made over the second MTP joint.  Dissection was carried down through the subcutaneous tissues.  The extensor tendons were lengthened.  The dorsal joint capsule was excised.  The metatarsal head was exposed.  A Weil osteotomy was made with the oscillating saw.  The head of the metatarsal was allowed to retract proximally and was fixed with a 2 mm Zimmer [employer]omet FRS screw.  The same procedure was then performed through a separate incision for the third metatarsal.  Attention was turned to the second toe where a transverse incision was made over the PIP joint.  Dissection was carried sharply down through the subcutaneous tissues and extensor mechanism.  The head of the proximal phalanx was resected followed by the base of the middle phalanx.  The joint was reduced and fixed with a 3.4 mm Zimmer [employer]omet VPC screw.  Same procedure was then performed for the third toe through a separate incision.  Final AP, lateral and oblique radiographs showed appropriate reduction of the second and third hammertoe deformities in appropriate position and length of all hardware.  The wounds were irrigated copiously and sprinkled with vancomycin powder.  Skin incisions were closed with nylon.  Sterile dressings were applied followed by a compression wrap.  The tourniquet was released after application of the dressings.  The patient was awakened from anesthesia and transported to the recovery room in  stable condition.   FOLLOW UP PLAN: Weightbearing as tolerated in a flat postop shoe.  Follow-up in the office in 2 weeks for suture removal.  Plan 6 weeks postoperative weightbearing immobilization.   RADIOGRAPHS: AP, lateral and oblique radiographs of the left foot are obtained  intraoperatively.  These show interval correction of the second and third hammertoe deformities with PIP joint arthrodesis and shortening of the second and third metatarsals.  Hardware is appropriately positioned and of the appropriate lengths.  No other acute injuries are noted.

## 2021-12-11 NOTE — Anesthesia Procedure Notes (Addendum)
Anesthesia Regional Block: Popliteal block   Pre-Anesthetic Checklist: , timeout performed,  Correct Patient, Correct Site, Correct Laterality,  Correct Procedure, Correct Position, site marked,  Risks and benefits discussed,  Surgical consent,  Pre-op evaluation,  At surgeon's request and post-op pain management  Laterality: Left  Prep: chloraprep       Needles:  Injection technique: Single-shot  Needle Type: Echogenic Stimulator Needle     Needle Length: 5cm  Needle Gauge: 22     Additional Needles:   Procedures:, nerve stimulator,,, ultrasound used (permanent image in chart),,     Nerve Stimulator or Paresthesia:  Response: foot, 0.45 mA  Additional Responses:   Narrative:  Start time: 12/11/2021 10:40 AM End time: 12/11/2021 10:45 AM Injection made incrementally with aspirations every 5 mL.  Performed by: Personally  Anesthesiologist: Janeece Riggers, MD  Additional Notes: Functioning IV was confirmed and monitors were applied.  A 65m 22ga Arrow echogenic stimulator needle was used. Sterile prep and drape,hand hygiene and sterile gloves were used. Ultrasound guidance: relevant anatomy identified, needle position confirmed, local anesthetic spread visualized around nerve(s)., vascular puncture avoided.  Image printed for medical record. Negative aspiration and negative test dose prior to incremental administration of local anesthetic. The patient tolerated the procedure well.    No pik

## 2021-12-11 NOTE — Anesthesia Postprocedure Evaluation (Signed)
Anesthesia Post Note  Patient: John Madden  Procedure(s) Performed: 2nd and 3rd WEIL OSTEOTOMY (Left) 2nd and 3rd HAMMER TOE CORRECTION (Left: Toe)     Patient location during evaluation: PACU Anesthesia Type: General Level of consciousness: awake and alert Pain management: pain level controlled Vital Signs Assessment: post-procedure vital signs reviewed and stable Respiratory status: spontaneous breathing, nonlabored ventilation and respiratory function stable Cardiovascular status: stable and blood pressure returned to baseline Anesthetic complications: no   No notable events documented.  Last Vitals:  Vitals:   12/11/21 1300 12/11/21 1315  BP: 126/70 118/75  Pulse: 83 78  Resp: 16 (!) 21  Temp:    SpO2: 98% 97%    Last Pain:  Vitals:   12/11/21 1315  TempSrc:   PainSc: 0-No pain                 Audry Pili

## 2021-12-11 NOTE — Anesthesia Preprocedure Evaluation (Signed)
Anesthesia Evaluation  Patient identified by MRN, date of birth, ID band Patient awake    Reviewed: Allergy & Precautions, H&P , NPO status , Patient's Chart, lab work & pertinent test results, reviewed documented beta blocker date and time   Airway Mallampati: II  TM Distance: >3 FB Neck ROM: full    Dental no notable dental hx.    Pulmonary neg pulmonary ROS,    Pulmonary exam normal breath sounds clear to auscultation       Cardiovascular Exercise Tolerance: Good negative cardio ROS   Rhythm:regular Rate:Normal     Neuro/Psych Anxiety  Neuromuscular disease negative psych ROS   GI/Hepatic negative GI ROS, Neg liver ROS,   Endo/Other  negative endocrine ROS  Renal/GU negative Renal ROS  negative genitourinary   Musculoskeletal  (+) Arthritis ,   Abdominal   Peds  Hematology negative hematology ROS (+)   Anesthesia Other Findings   Reproductive/Obstetrics negative OB ROS                             Anesthesia Physical Anesthesia Plan  ASA: 2  Anesthesia Plan: General   Post-op Pain Management: Regional block* and Tylenol PO (pre-op)*   Induction:   PONV Risk Score and Plan: 2 and Ondansetron and Treatment may vary due to age or medical condition  Airway Management Planned: Oral ETT and LMA  Additional Equipment: None  Intra-op Plan:   Post-operative Plan: Extubation in OR  Informed Consent: I have reviewed the patients History and Physical, chart, labs and discussed the procedure including the risks, benefits and alternatives for the proposed anesthesia with the patient or authorized representative who has indicated his/her understanding and acceptance.     Dental Advisory Given  Plan Discussed with: CRNA and Anesthesiologist  Anesthesia Plan Comments: (Discussed both nerve block for pain relief post-op and GA; including NV, sore throat, dental injury, and pulmonary  complications)        Anesthesia Quick Evaluation

## 2021-12-11 NOTE — Progress Notes (Signed)
Assisted Dr. Ambrose Pancoast with left, popliteal, ultrasound guided block. Side rails up, monitors on throughout procedure. See vital signs in flow sheet. Tolerated Procedure well.

## 2021-12-11 NOTE — Anesthesia Procedure Notes (Signed)
Procedure Name: LMA Insertion Date/Time: 12/11/2021 12:03 PM  Performed by: Maryella Shivers, CRNAPre-anesthesia Checklist: Patient identified, Emergency Drugs available, Suction available and Patient being monitored Patient Re-evaluated:Patient Re-evaluated prior to induction Oxygen Delivery Method: Circle system utilized Preoxygenation: Pre-oxygenation with 100% oxygen Induction Type: IV induction Ventilation: Mask ventilation without difficulty LMA: LMA inserted LMA Size: 4.0 Number of attempts: 1 Airway Equipment and Method: Bite block Placement Confirmation: positive ETCO2 Tube secured with: Tape Dental Injury: Teeth and Oropharynx as per pre-operative assessment

## 2021-12-12 ENCOUNTER — Encounter (HOSPITAL_BASED_OUTPATIENT_CLINIC_OR_DEPARTMENT_OTHER): Payer: Self-pay | Admitting: Orthopedic Surgery

## 2021-12-12 ENCOUNTER — Ambulatory Visit: Payer: 59 | Admitting: Family

## 2021-12-28 ENCOUNTER — Encounter: Payer: Self-pay | Admitting: Family

## 2022-01-21 ENCOUNTER — Encounter: Payer: Self-pay | Admitting: Family

## 2022-01-22 MED ORDER — TAMSULOSIN HCL 0.4 MG PO CAPS: 0.8000 mg | ORAL_CAPSULE | Freq: Every day | ORAL | 0 refills | Status: DC

## 2022-02-24 ENCOUNTER — Other Ambulatory Visit: Payer: Self-pay | Admitting: Family

## 2022-02-26 ENCOUNTER — Telehealth: Payer: Self-pay | Admitting: Gastroenterology

## 2022-02-26 NOTE — Telephone Encounter (Signed)
Good Morning Dr. Candis Schatz  Supervising MD 6/16 AM  We received a referral for Mr. John Madden to transfer his care from [place] to [place]. The patient is requesting this transfer due to moving to [facility] and the drive will be closer for him.  We have records for review. Please advise on scheduling.  Thank you.

## 2022-03-06 ENCOUNTER — Encounter: Payer: Self-pay | Admitting: Gastroenterology

## 2022-04-07 ENCOUNTER — Encounter: Payer: Self-pay | Admitting: Gastroenterology

## 2022-04-07 ENCOUNTER — Ambulatory Visit (INDEPENDENT_AMBULATORY_CARE_PROVIDER_SITE_OTHER): Payer: 59 | Admitting: Gastroenterology

## 2022-04-07 VITALS — BP 130/80 | HR 76 | Ht 69.0 in | Wt 228.0 lb

## 2022-04-07 DIAGNOSIS — K649 Unspecified hemorrhoids: Secondary | ICD-10-CM

## 2022-04-07 DIAGNOSIS — K5900 Constipation, unspecified: Secondary | ICD-10-CM | POA: Diagnosis not present

## 2022-04-07 DIAGNOSIS — Z86010 Personal history of colonic polyps: Secondary | ICD-10-CM

## 2022-04-07 DIAGNOSIS — Z800 Family history of malignant neoplasm of digestive organs: Secondary | ICD-10-CM

## 2022-04-07 DIAGNOSIS — K640 First degree hemorrhoids: Secondary | ICD-10-CM | POA: Diagnosis not present

## 2022-04-07 NOTE — Patient Instructions (Addendum)
_______________________________________________________  If you are age 64 or older, your body mass index should be between 23-30. Your Body mass index is 33.67 kg/m. If this is out of the aforementioned range listed, please consider follow up with your Primary Care Provider.  If you are age 27 or younger, your body mass index should be between 19-25. Your Body mass index is 33.67 kg/m. If this is out of the aformentioned range listed, please consider follow up with your Primary Care Provider.   Start Metamucil or Benefiber daily.  Use Miralax or Ducolax as needed.  The [facility] providers would like to encourage you to use [facility] to communicate with providers for non-urgent requests or questions.  Due to long hold times on the telephone, sending your provider a message by [place] may be a faster and more efficient way to get a response.  Please allow 48 business hours for a response.  Please remember that this is for non-urgent requests.   It was a pleasure to see you today!  Thank you for trusting me with your gastrointestinal care!    Scott E.Candis Schatz, MD

## 2022-04-07 NOTE — Progress Notes (Signed)
HPI :  John Madden is a very pleasant 66 year old male with a history of Parkinson's disease and anxiety who is referred to [place] by Jodi Mourning, FNP for further evaluation of hematochezia.  He has been seeing blood in his stool intermittently for about 8 years.  He has chronic constipation.  Mostly on toilet paper.  He takes MiraLax a few times a week and Dulcolax a few times a week. Takes Benefiber on occasion Sometimes has seepage, denies symptoms of prolapsing hemorrhoids. Having a bowel movement every 1-2 days.   Stools are often hard and pellet-like, which is when he sees blood. Seeing blood about 10-20% of the time. No diarrhea.  No pain except when he is constipated.  Weight is stable.    His mother was diagnosed with colon cancer  The patient was previously followed by Dr. Kyra Leyland of [place]. Colonoscopy 06-30-2019:  67m sigmoid TA, left sided diverticulosis Colonoscopy 06-01-2014:  Left sided diverticulosis Colonoscopy 05-17-2009:  Rectal Hps, left sided diverticulosis EGD 06-01-2014:  Normal, gastric bx neg H. pylori   Past Medical History:  Diagnosis Date   Anxiety    Back pain, chronic    Bursitis    left shoulder   Hyperlipidemia    [place]      Past Surgical History:  Procedure Laterality Date   ankle surgery     left ankle   APPENDECTOMY     HAMMER TOE SURGERY Left 12/11/2021   Procedure: 2nd and 3rd HAMMER TOE CORRECTION;  Surgeon: HWylene Simmer MD;  [facility]  Service: Orthopedics;  Laterality: Left;  regional block  75   HERNIA REPAIR     WEIL OSTEOTOMY Left 12/11/2021   Procedure: 2nd and [place]  Surgeon: HWylene Simmer MD;  [place] [facility]  Service: Orthopedics;  Laterality: Left;  regional block 756  Family History  Problem Relation Age of Onset   Lymphoma Mother        2016 remission   Lung cancer Father    Social History    Tobacco Use   Smoking status: Never   Smokeless tobacco: Never  Substance Use Topics   Alcohol use: Yes    Comment: moderate   Drug use: No   Current Outpatient Medications  Medication Sig Dispense Refill   Amantadine HCl 100 MG tablet Take 150 mg by mouth in the morning, at noon, and at bedtime.     carbidopa-levodopa (SINEMET IR) 25-100 MG tablet Take 1-2 tablets by mouth in the morning and at bedtime. 2 tabs qam, 1 tab qpm     ketoconazole (NIZORAL) 2 % shampoo Apply 1 application topically 2 (two) times a week. 120 mL 6   Multiple Vitamin (MULTIVITAMIN) tablet Take 1 tablet by mouth daily.     tadalafil (CIALIS) 5 MG tablet Take 1 tablet (5 mg total) by mouth daily as needed for erectile dysfunction. 90 tablet 3   tamsulosin (FLOMAX) 0.4 MG CAPS capsule TAKE 2 CAPSULES(0.8 MG) BY MOUTH DAILY AFTER SUPPER 180 capsule 0   No current facility-administered medications for this visit.   No Known Allergies   Review of Systems: All systems reviewed and negative except where noted in HPI.    No results found.  Physical Exam: There were no vitals taken for this visit. Constitutional: Pleasant,well-developed, ***male in no acute distress. HEENT: Normocephalic and atraumatic. Conjunctivae are normal. No scleral icterus. Neck supple.  Cardiovascular: Normal rate,  regular rhythm.  Pulmonary/chest: Effort normal and breath sounds normal. No wheezing, rales or rhonchi. Abdominal: Soft, nondistended, nontender. Bowel sounds active throughout. There are no masses palpable. No hepatomegaly. Extremities: no edema Lymphadenopathy: No cervical adenopathy noted. Neurological: Alert and oriented to person place and time. Skin: Skin is warm and dry. No rashes noted. Psychiatric: Normal mood and affect. Behavior is normal.  CBC    Component Value Date/Time   WBC 6.0 06/10/2021 0901   RBC 5.04 06/10/2021 0901   HGB 15.5 06/10/2021 0901   HCT 46.9 06/10/2021 0901   PLT 139.0 (L)  06/10/2021 0901   MCV 93.2 06/10/2021 0901   MCH 31.4 12/05/2020 1145   MCHC 33.1 06/10/2021 0901   RDW 13.4 06/10/2021 0901   LYMPHSABS 1.7 06/10/2021 0901   MONOABS 0.4 06/10/2021 0901   EOSABS 0.2 06/10/2021 0901   BASOSABS 0.0 06/10/2021 0901    CMP     Component Value Date/Time   NA 139 06/10/2021 0901   K 4.0 06/10/2021 0901   CL 103 06/10/2021 0901   CO2 28 06/10/2021 0901   GLUCOSE 87 06/10/2021 0901   BUN 22 06/10/2021 0901   CREATININE 1.11 06/10/2021 0901   CREATININE 1.17 01/01/2020 1045   CALCIUM 9.5 06/10/2021 0901   PROT 6.7 06/10/2021 0901   ALBUMIN 4.4 06/10/2021 0901   AST 18 06/10/2021 0901   ALT 13 06/10/2021 0901   ALKPHOS 42 06/10/2021 0901   BILITOT 1.0 06/10/2021 0901   GFRNONAA >60 12/05/2020 1145     ASSESSMENT AND PLAN: Constipation Metamucil/benefiber daily + Miralax/Dulcolax PRN  Hemorrhoids Not interested in banding at this time  Fam hx CRC/personal hx TA  Marrian Salvage,*

## 2022-04-27 ENCOUNTER — Encounter: Payer: Self-pay | Admitting: Family

## 2022-04-27 ENCOUNTER — Other Ambulatory Visit: Payer: Self-pay | Admitting: Family

## 2022-04-27 MED ORDER — TAMSULOSIN HCL 0.4 MG PO CAPS: 0.4000 mg | ORAL_CAPSULE | Freq: Every day | ORAL | 0 refills | Status: DC

## 2022-04-28 MED ORDER — TAMSULOSIN HCL 0.4 MG PO CAPS: 0.8000 mg | ORAL_CAPSULE | Freq: Every day | ORAL | 1 refills | Status: DC

## 2022-04-28 NOTE — Telephone Encounter (Signed)
FYI to provider

## 2022-06-22 ENCOUNTER — Encounter: Payer: Self-pay | Admitting: Family

## 2022-06-23 ENCOUNTER — Telehealth: Payer: Self-pay

## 2022-06-23 NOTE — Telephone Encounter (Signed)
PA initiated via Covermymeds; KEY: [id number].  This medication or product is on your plan's list of covered drugs. Prior authorization is not required at this time. If your pharmacy has questions regarding the processing of your prescription, please have them call the [facility] pharmacy help desk at ([phone]. **Please note: This request was submitted electronically. Formulary lowering, tiering exception, cost reduction and/or pre-benefit determination review (including prospective Medicare hospice reviews) requests cannot be requested using this method of submission. Providers contact us at [phone] for further assistance.

## 2022-06-24 ENCOUNTER — Other Ambulatory Visit: Payer: Self-pay | Admitting: Family

## 2022-07-28 ENCOUNTER — Encounter: Payer: Self-pay | Admitting: Family

## 2022-07-28 ENCOUNTER — Ambulatory Visit (INDEPENDENT_AMBULATORY_CARE_PROVIDER_SITE_OTHER): Payer: 59 | Admitting: Family

## 2022-07-28 VITALS — BP 136/84 | HR 75 | Ht 69.0 in | Wt 226.4 lb

## 2022-07-28 DIAGNOSIS — Z125 Encounter for screening for malignant neoplasm of prostate: Secondary | ICD-10-CM

## 2022-07-28 DIAGNOSIS — Z0000 Encounter for general adult medical examination without abnormal findings: Secondary | ICD-10-CM | POA: Diagnosis not present

## 2022-07-28 DIAGNOSIS — R109 Unspecified abdominal pain: Secondary | ICD-10-CM

## 2022-07-28 DIAGNOSIS — Z13220 Encounter for screening for lipoid disorders: Secondary | ICD-10-CM | POA: Diagnosis not present

## 2022-07-28 NOTE — Progress Notes (Signed)
Noelan Nicodemus is a 67 y.o. 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     carbidopa-levodopa (SINEMET IR) 25-100 MG tablet Take 1-2 tablets by mouth in the morning and at bedtime. 2 tabs qam, 1 tab qpm     ketoconazole (NIZORAL) 2 % shampoo Apply 1 application topically 2 (two) times a week. 120 mL 6   Multiple Vitamin (MULTIVITAMIN) tablet Take 1 tablet by mouth daily.     tadalafil (CIALIS) 5 MG tablet TAKE 1 TABLET(5 MG) BY MOUTH DAILY AS NEEDED FOR ERECTILE DYSFUNCTION 90 tablet 3   tamsulosin (FLOMAX) 0.4 MG CAPS capsule Take 2 capsules (0.8 mg total) by mouth daily after supper. 180 capsule 1   No current facility-administered medications for this visit.    Allergies: Patient has no known allergies.  Past Medical History:  Diagnosis Date   Anxiety    Back pain, chronic    Bursitis    left shoulder   Hyperlipidemia    Parkinson disease     Past Surgical History:  Procedure Laterality Date   ankle surgery     left ankle   APPENDECTOMY     HAMMER TOE SURGERY Left 12/11/2021   Procedure: 2nd and 3rd HAMMER TOE CORRECTION;  Surgeon: Wylene Simmer, MD;  Location: [facility];  Service: Orthopedics;  Laterality: Left;  regional block  75   HERNIA REPAIR     WEIL OSTEOTOMY Left 12/11/2021   Procedure: 2nd and [place];  Surgeon: Wylene Simmer, MD;  Location: [place];  Service: Orthopedics;  Laterality: Left;  regional block 76    Family History  Problem  Relation Age of Onset   Lymphoma Mother        2016 remission   Lung cancer Father     Social History   Tobacco Use   Smoking status: Never   Smokeless tobacco: Never  Substance Use Topics   Alcohol use: Yes    Comment: moderate    Subjective:   Presents for yearly CPE; continuing to work with his neurologist at [facility] for management of Parkinson's; has been having some right sided abdominal pain- concerned that location correlates with location of past hernia surgery/ mesh; has noticed that pain has improved with increased stretching however;   Review of Systems  Constitutional: Negative.   HENT: Negative.    Eyes: Negative.   Respiratory: Negative.    Cardiovascular: Negative.   Gastrointestinal: Negative.   Genitourinary: Negative.   Musculoskeletal: Negative.   Skin: Negative.   Neurological: Negative.   Endo/Heme/Allergies: Negative.   Psychiatric/Behavioral: Negative.       Objective:  Vitals:   07/28/22 1325  BP: 136/84  Pulse: 75  SpO2: 96%  Weight: 226 lb 6.4 oz (102.7 kg)  Height: 5' 9"$  (1.753 m)    General: Well developed, well nourished, in no acute distress  Skin : Warm and dry.  Head: Normocephalic and atraumatic  Eyes: Sclera and conjunctiva clear; pupils round and reactive to  light; extraocular movements intact  Ears: External normal; canals clear; tympanic membranes normal  Oropharynx: Pink, supple. No suspicious lesions  Neck: Supple without thyromegaly, adenopathy  Lungs: Respirations unlabored; clear to auscultation bilaterally without wheeze, rales, rhonchi  CVS exam: normal rate and regular rhythm.  Abdomen: Soft; nontender; nondistended; normoactive bowel sounds; no masses or hepatosplenomegaly  Musculoskeletal: No deformities; no active joint inflammation  Extremities: No edema, cyanosis, clubbing  Vessels: Symmetric bilaterally  Neurologic: Alert and oriented; speech intact; face symmetrical; moves all extremities well; CNII-XII intact  without focal deficit   Assessment:  1. PE (physical exam), annual   2. Abdominal pain, unspecified abdominal location   3. Lipid screening   4. Prostate cancer screening     Plan:  Age appropriate preventive healthcare needs addressed; encouraged regular eye doctor and dental exams; encouraged regular exercise; will update labs and refills as needed today; follow-up to be determined; Will update abdominal/ pelvic CT- assuming CT is normal, to consider PT for core/ back strengthening;   No follow-ups on file.  Orders Placed This Encounter  Procedures   CT Abdomen Pelvis W Contrast    Standing Status:   Future    Standing Expiration Date:   07/29/2023    Order Specific Question:   If indicated for the ordered procedure, I authorize the administration of contrast media per Radiology protocol    Answer:   Yes    Order Specific Question:   Does the patient have a contrast media/X-ray dye allergy?    Answer:   Yes    Order Specific Question:   Preferred imaging location?    Answer:   [facility]    Order Specific Question:   Release to patient    Answer:   Immediate [1]    Order Specific Question:   Is Oral Contrast requested for this exam?    Answer:   Yes, Per Radiology protocol   CBC with Differential/Platelet   Comp Met (CMET)   Lipid panel   PSA    Requested Prescriptions    No prescriptions requested or ordered in this encounter

## 2022-07-29 LAB — CBC WITH DIFFERENTIAL/PLATELET
Basophils Absolute: 0.1 10*3/uL (ref 0.0–0.1)
Basophils Relative: 0.7 % (ref 0.0–3.0)
Eosinophils Absolute: 0.3 10*3/uL (ref 0.0–0.7)
Eosinophils Relative: 3.5 % (ref 0.0–5.0)
HCT: 48.5 % (ref 39.0–52.0)
Hemoglobin: 16.2 g/dL (ref 13.0–17.0)
Lymphocytes Relative: 28 % (ref 12.0–46.0)
Lymphs Abs: 2.1 10*3/uL (ref 0.7–4.0)
MCHC: 33.4 g/dL (ref 30.0–36.0)
MCV: 95.2 fl (ref 78.0–100.0)
Monocytes Absolute: 0.5 10*3/uL (ref 0.1–1.0)
Monocytes Relative: 7.1 % (ref 3.0–12.0)
Neutro Abs: 4.6 10*3/uL (ref 1.4–7.7)
Neutrophils Relative %: 60.7 % (ref 43.0–77.0)
Platelets: 174 10*3/uL (ref 150.0–400.0)
RBC: 5.09 Mil/uL (ref 4.22–5.81)
RDW: 13.4 % (ref 11.5–15.5)
WBC: 7.5 10*3/uL (ref 4.0–10.5)

## 2022-07-29 LAB — COMPREHENSIVE METABOLIC PANEL
ALT: 7 U/L (ref 0–53)
AST: 15 U/L (ref 0–37)
Albumin: 4.6 g/dL (ref 3.5–5.2)
Alkaline Phosphatase: 50 U/L (ref 39–117)
BUN: 22 mg/dL (ref 6–23)
CO2: 29 mEq/L (ref 19–32)
Calcium: 10.2 mg/dL (ref 8.4–10.5)
Chloride: 101 mEq/L (ref 96–112)
Creatinine, Ser: 1.13 mg/dL (ref 0.40–1.50)
GFR: 68.82 mL/min (ref >60.00)
Glucose, Bld: 85 mg/dL (ref 70–99)
Potassium: 4 mEq/L (ref 3.5–5.1)
Sodium: 139 mEq/L (ref 135–145)
Total Bilirubin: 0.9 mg/dL (ref 0.2–1.2)
Total Protein: 7.2 g/dL (ref 6.0–8.3)

## 2022-07-29 LAB — LIPID PANEL
Cholesterol: 200 mg/dL (ref 0–200)
HDL: 41.9 mg/dL (ref >39.00)
LDL Cholesterol: 125 mg/dL — ABNORMAL HIGH (ref 0–99)
NonHDL: 157.84
Total CHOL/HDL Ratio: 5
Triglycerides: 163 mg/dL — ABNORMAL HIGH (ref 0.0–149.0)
VLDL: 32.6 mg/dL (ref 0.0–40.0)

## 2022-07-29 LAB — PSA: PSA: 0.9 ng/mL (ref 0.10–4.00)

## 2022-07-30 ENCOUNTER — Encounter: Payer: Self-pay | Admitting: Family

## 2022-08-04 ENCOUNTER — Ambulatory Visit
Admission: RE | Admit: 2022-08-04 | Discharge: 2022-08-04 | Disposition: A | Discharge status: Home / Self Care | Payer: 59 | Source: Ambulatory Visit | Attending: Family | Admitting: Family

## 2022-08-04 DIAGNOSIS — R109 Unspecified abdominal pain: Secondary | ICD-10-CM

## 2022-08-04 MED ORDER — IOPAMIDOL (ISOVUE-300) INJECTION 61%
100.0000 mL | Freq: Once | INTRAVENOUS | Status: AC | PRN
  Administered 2022-08-04: 100 mL via INTRAVENOUS

## 2022-09-18 ENCOUNTER — Encounter: Payer: Self-pay | Admitting: Family

## 2022-11-04 IMAGING — CR DG CERVICAL SPINE COMPLETE 4+V
5 series · 5 of 5 positions shown · non-contrast
Comparison: None.

CLINICAL DATA: Chronic neck and right shoulder pain. No reported
injury.

EXAM:
CERVICAL SPINE - COMPLETE 4+ VIEW

[w cervical spine lat]
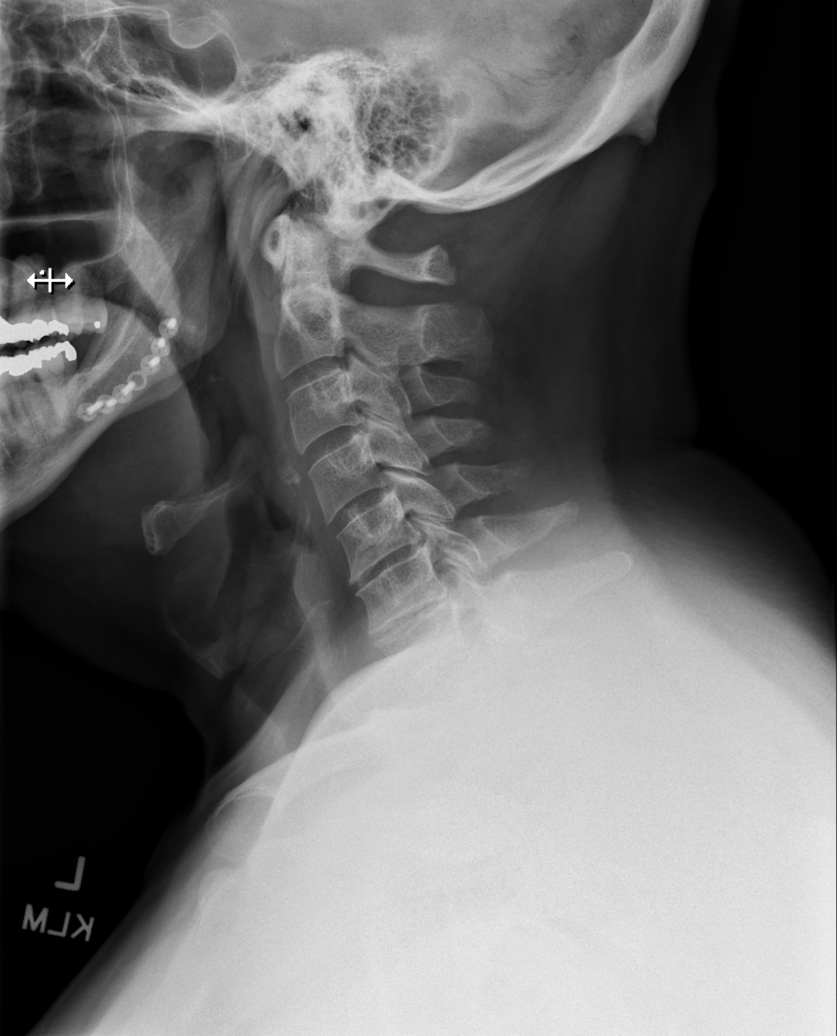

[w cervical spine ap_obl (1 of 2)]
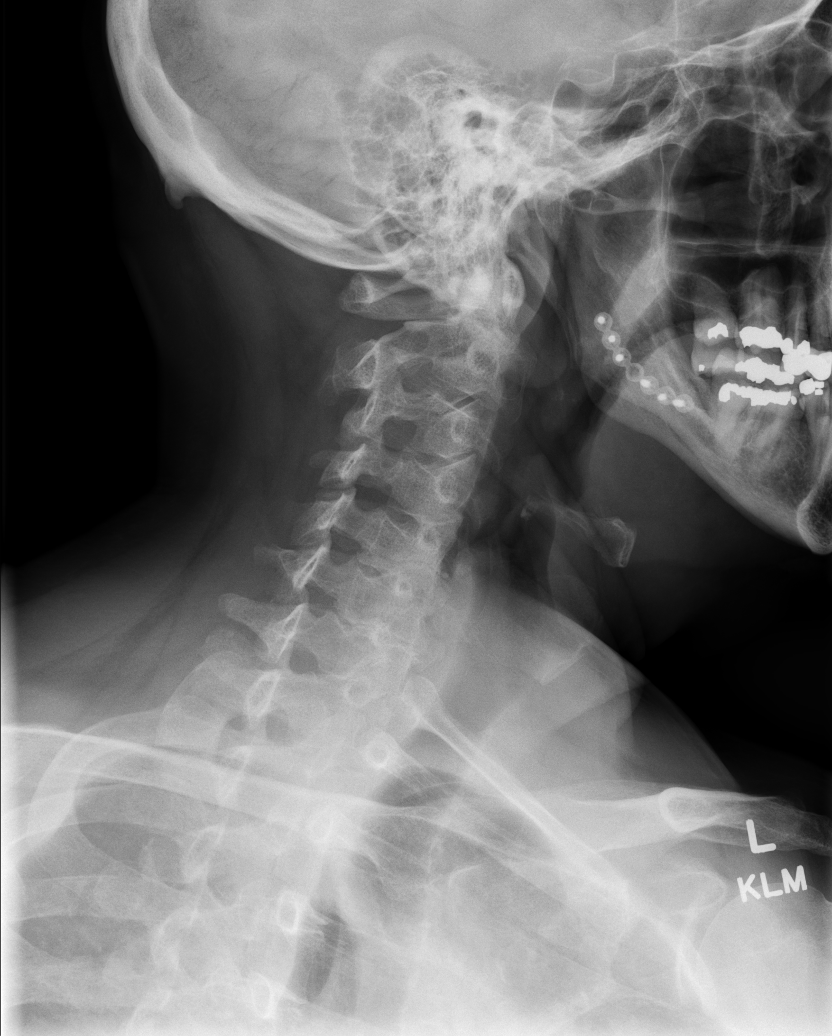

[w cervical spine ap_obl (2 of 2)]
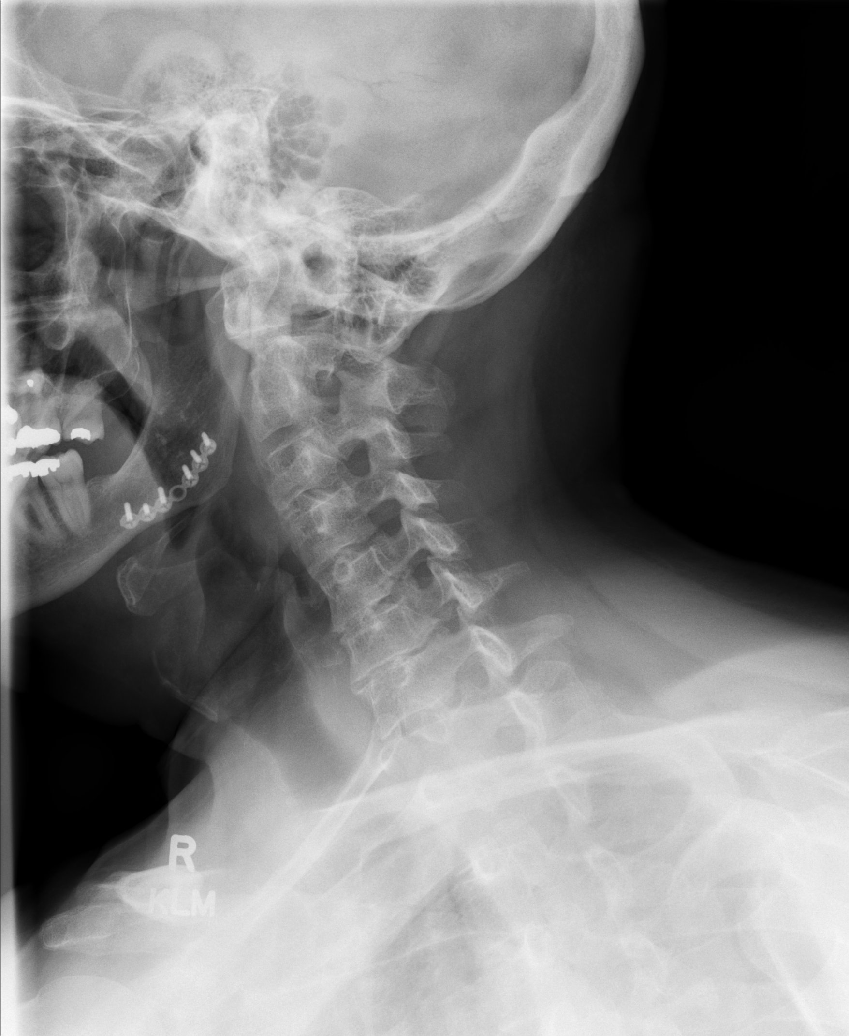

[w cervical spine ap]
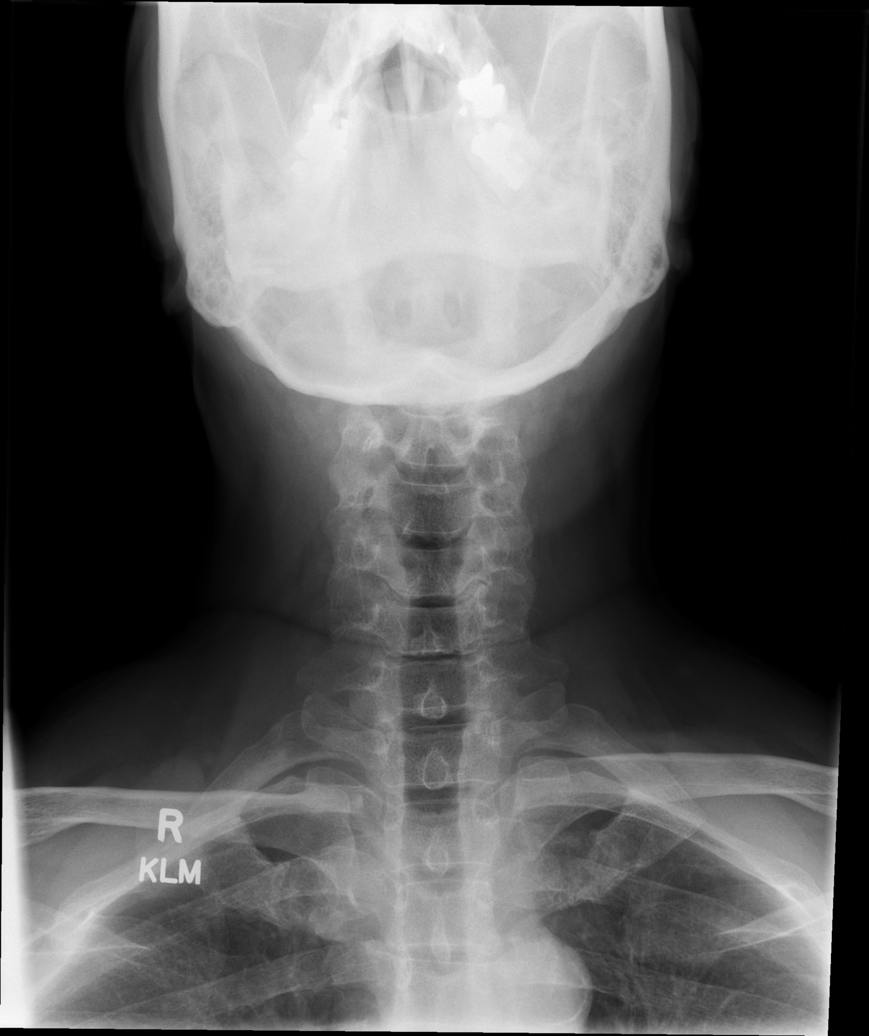

[w cervical spine odontoid]
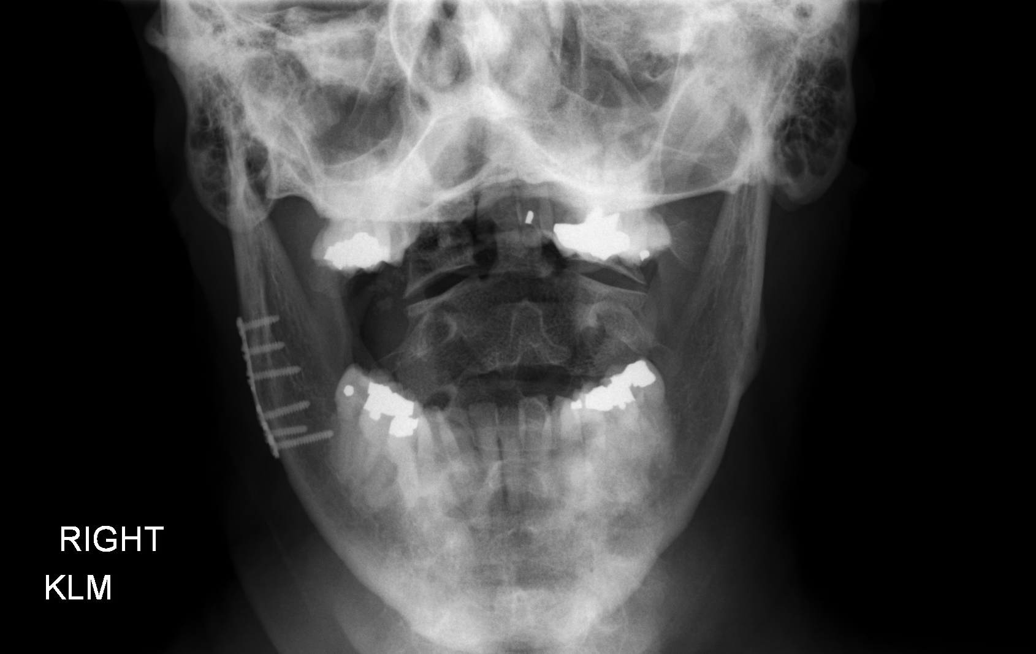

[5 of 5 positions shown; findings below may reference images not displayed]

FINDINGS: On the lateral view the cervical spine is visualized to the level of
C7-T1. Straightening of the cervical spine. Pre-vertebral soft
tissues are within normal limits. No fracture is detected in the
cervical spine. Dens is well positioned between the lateral masses
of C1. Mild degenerative disc disease in the mid to lower cervical
spine, most prominent at C6-7. No subluxation. No significant facet
arthropathy. No appreciable foraminal stenosis. No
aggressive-appearing focal osseous lesions. Intact fixation hardware
noted at the right angle of mandible.
IMPRESSION: 1. Mild degenerative disc disease in the mid to lower cervical
spine, most prominent at C6-7.
2. Straightening of the cervical spine, usually due to positioning
and/or muscle spasm.

## 2022-11-04 IMAGING — CR DG THORACIC SPINE 3V
3 series · 3 of 3 positions shown · non-contrast
Comparison: None.

CLINICAL DATA: Neck and right shoulder pain, chronic for 1.5 years.
No reported injury.

EXAM:
THORACIC SPINE - 3 VIEWS

[w thoracic swimmers]
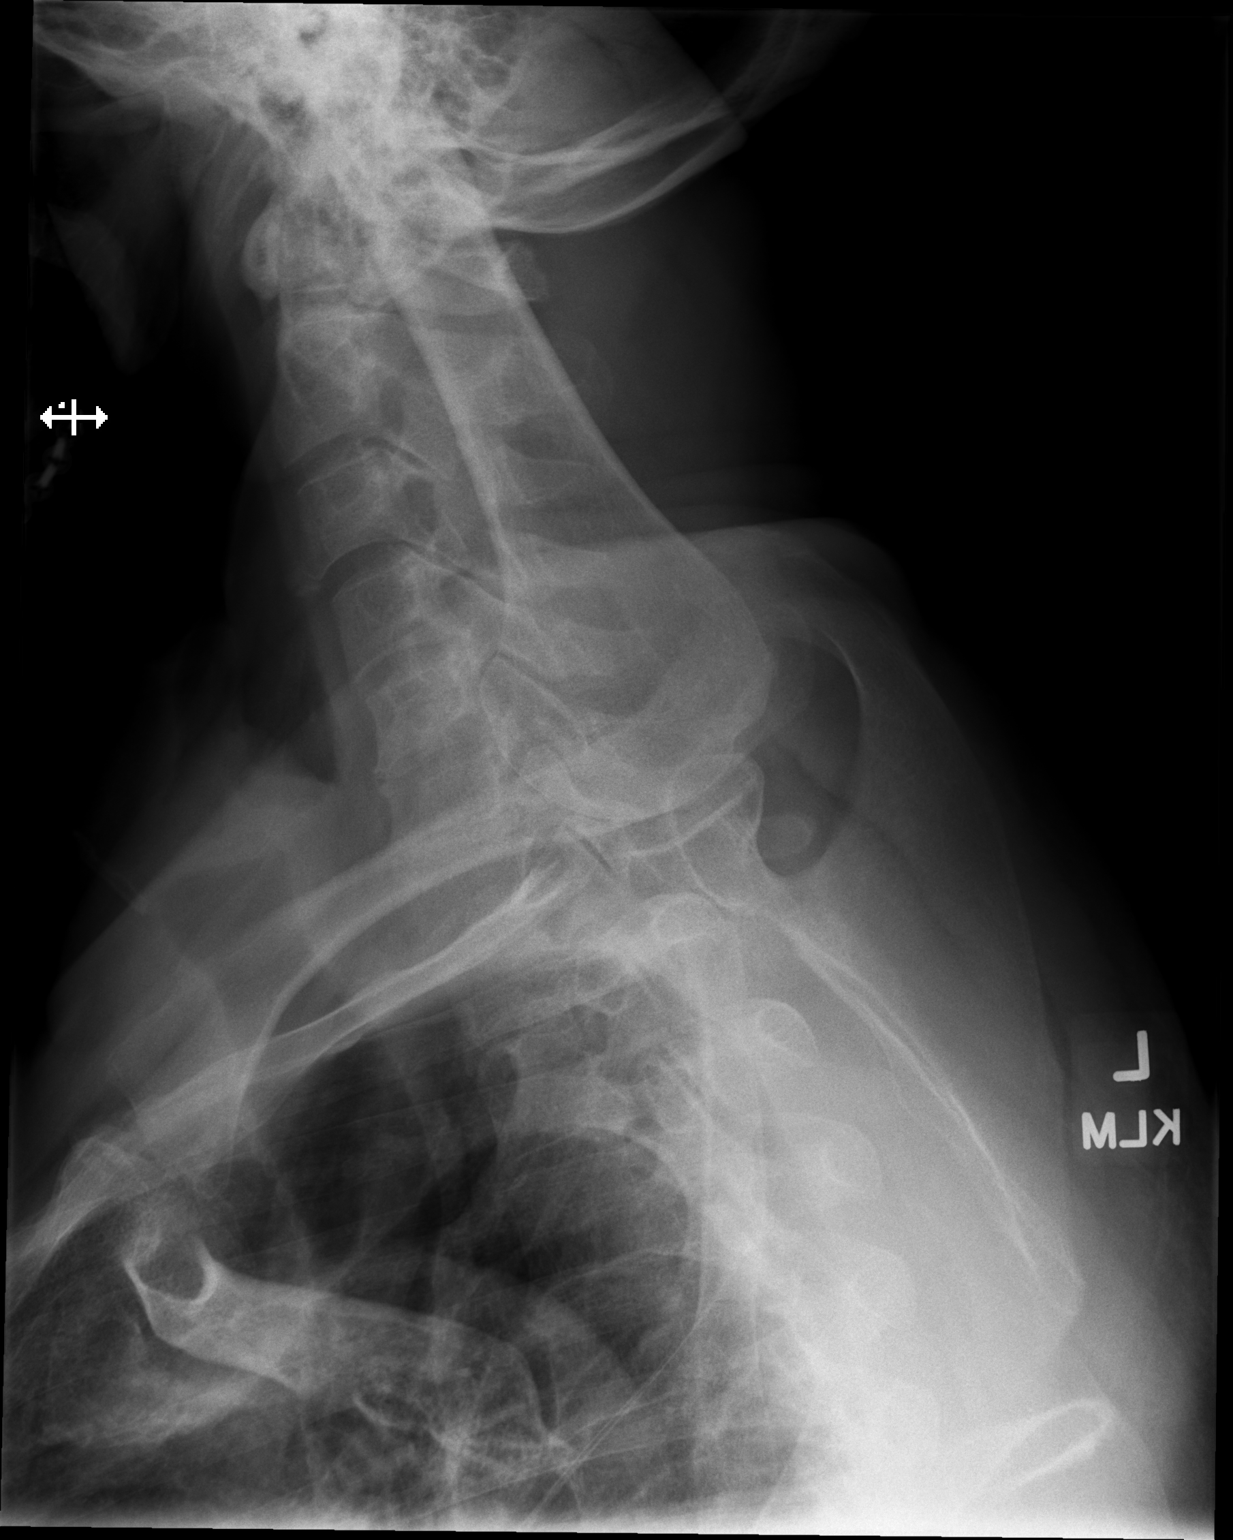

[w thoracic spine lat]
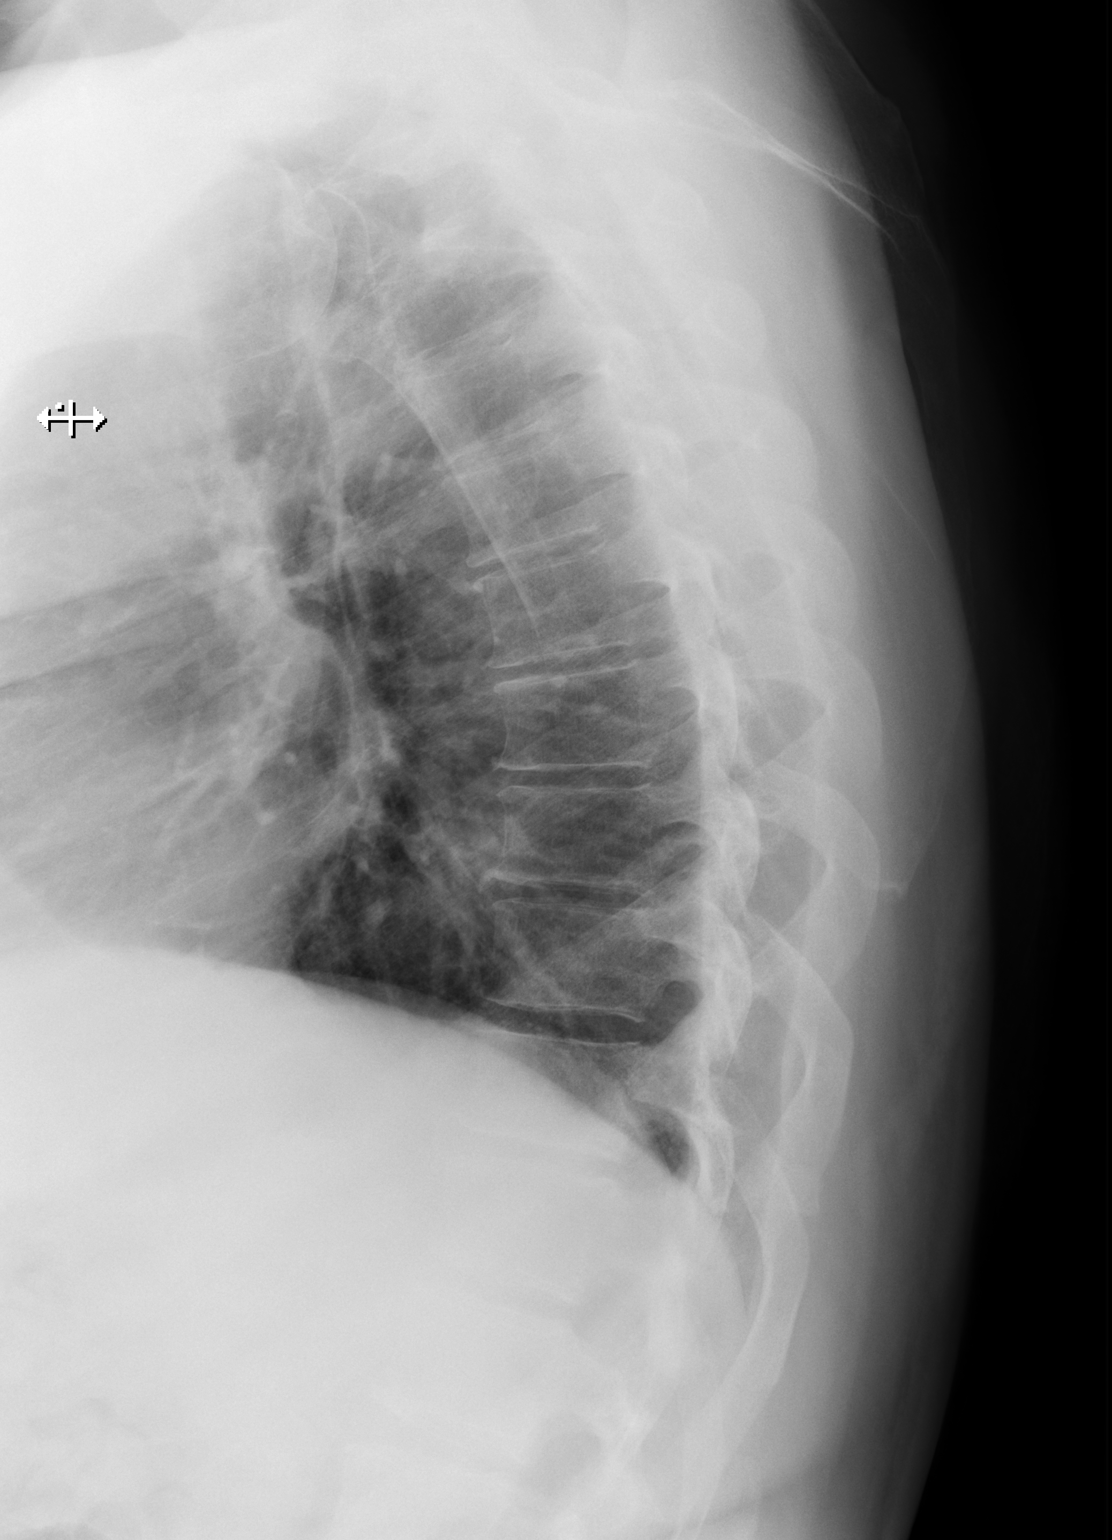

[w thoracic spine ap]
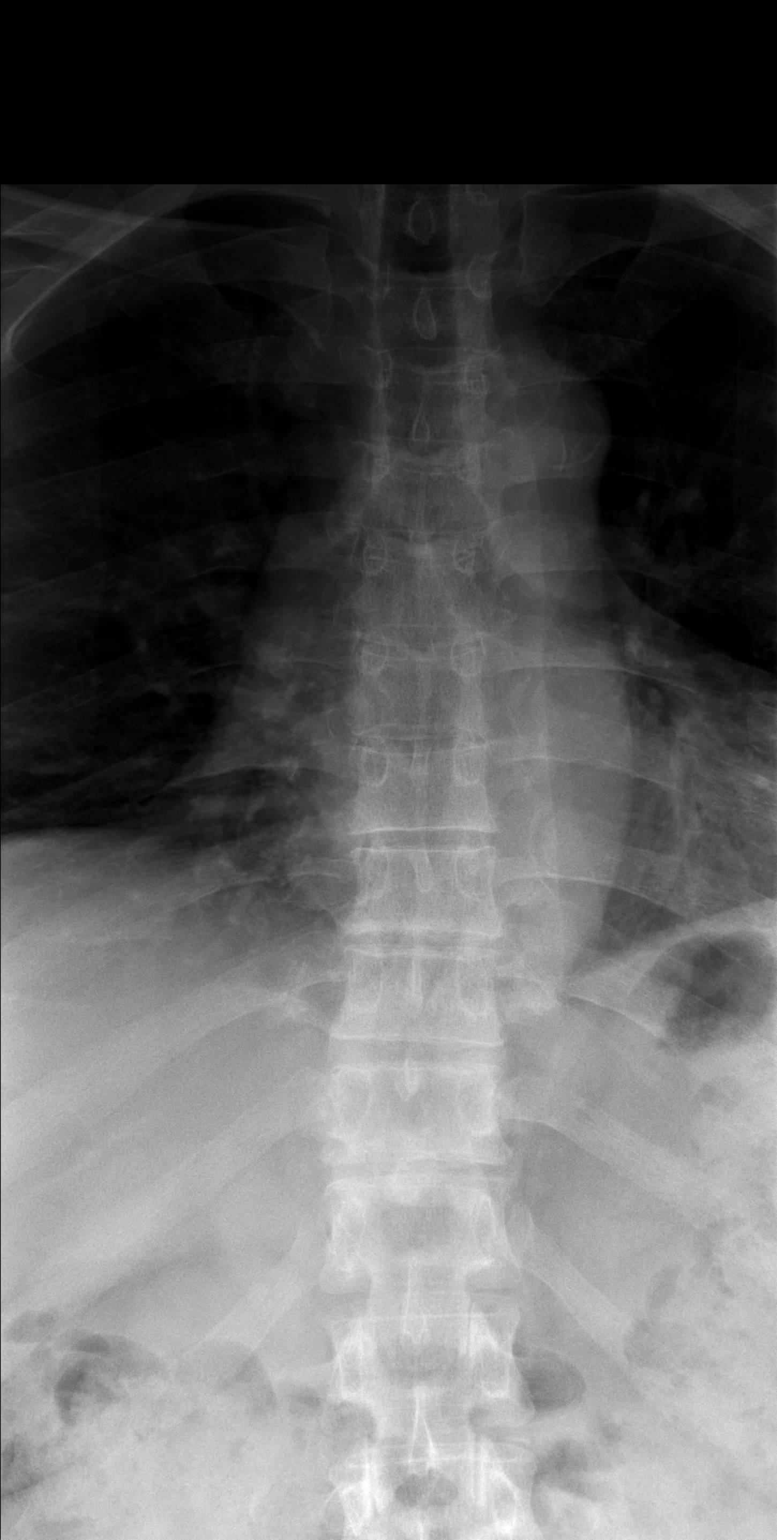

[3 of 3 positions shown; findings below may reference images not displayed]

FINDINGS: Preserved thoracic vertebral body heights, with no fracture or
subluxation. No suspicious focal osseous lesions. No significant
degenerative disc disease.
IMPRESSION: No acute osseous abnormality. No significant degenerative disc
disease.

## 2022-11-17 ENCOUNTER — Encounter: Payer: Self-pay | Admitting: Family

## 2022-11-17 ENCOUNTER — Ambulatory Visit: Payer: 59 | Admitting: Family

## 2022-11-17 VITALS — BP 134/78 | HR 70 | Ht 69.0 in | Wt 222.6 lb

## 2022-11-17 DIAGNOSIS — M542 Cervicalgia: Secondary | ICD-10-CM

## 2022-11-17 NOTE — Progress Notes (Unsigned)
John Madden is a 67 y.o. 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     carbidopa-levodopa (SINEMET IR) 25-100 MG tablet Take 1-2 tablets by mouth in the morning and at bedtime. 2 tabs qam, 1 tab qpm     ketoconazole (NIZORAL) 2 % shampoo Apply 1 application topically 2 (two) times a week. 120 mL 6   Multiple Vitamin (MULTIVITAMIN) tablet Take 1 tablet by mouth daily.     tadalafil (CIALIS) 5 MG tablet TAKE 1 TABLET(5 MG) BY MOUTH DAILY AS NEEDED FOR ERECTILE DYSFUNCTION 90 tablet 3   tamsulosin (FLOMAX) 0.4 MG CAPS capsule Take 2 capsules (0.8 mg total) by mouth daily after supper. 180 capsule 1   No current facility-administered medications for this visit.    Allergies: Patient has no known allergies.  Past Medical History:  Diagnosis Date   Anxiety    Back pain, chronic    Bursitis    left shoulder   Hyperlipidemia    Parkinson disease     Past Surgical History:  Procedure Laterality Date   ankle surgery     left ankle   APPENDECTOMY     HAMMER TOE SURGERY Left 12/11/2021   Procedure: 2nd and 3rd HAMMER TOE CORRECTION;  Surgeon: Toni Arthurs, MD;  Location: [facility];  Service: Orthopedics;  Laterality: Left;  regional block  75   HERNIA REPAIR     WEIL OSTEOTOMY Left 12/11/2021   Procedure: 2nd and 3rd WEIL OSTEOTOMY;  Surgeon: Toni Arthurs, MD;  Location: [facility];  Service: Orthopedics;  Laterality: Left;  regional block 75    Family History  Problem  Relation Age of Onset   Lymphoma Mother        2016 remission   Lung cancer Father     Social History   Tobacco Use   Smoking status: Never   Smokeless tobacco: Never  Substance Use Topics   Alcohol use: Yes    Comment: moderate    Subjective:   Right sided jaw pain x 1 day; history of broken jaw on this side 30+ years ago; is planning to have extensive dental work next week but symptoms are localized on left side of mouth;      Objective:  Vitals:   11/17/22 1532  BP: 134/78  Pulse: 70  SpO2: 97%  Weight: 222 lb 9.6 oz (101 kg)  Height: 5\' 9"  (1.753 m)    General: Well developed, well nourished, in no acute distress  Skin : Warm and dry.  Head: Normocephalic and atraumatic  Eyes: Sclera and conjunctiva clear; pupils round and reactive to light; extraocular movements intact  Ears: External normal; canals clear; tympanic membranes normal  Oropharynx: Pink, supple. No suspicious lesions  Neck: Supple without thyromegaly, adenopathy  Lungs: Respirations unlabored; clear to auscultation bilaterally without wheeze, rales, rhonchi  CVS exam: {heart exam:315510::"normal rate, regular rhythm, normal S1, S2, no murmurs, rubs, clicks or gallops"}.  Abdomen: Soft; nontender; nondistended; normoactive bowel sounds; no masses  or hepatosplenomegaly  Musculoskeletal: No deformities; no active joint inflammation  Extremities: No edema, cyanosis, clubbing  Vessels: Symmetric bilaterally  Neurologic: Alert and oriented; speech intact; face symmetrical; moves all extremities well; CNII-XII intact without focal deficit  Assessment:  No diagnosis found.  Plan:  ***   No follow-ups on file.  No orders of the defined types were placed in this encounter.   Requested Prescriptions    No prescriptions requested or ordered in this encounter

## 2022-12-09 ENCOUNTER — Ambulatory Visit
Admission: RE | Admit: 2022-12-09 | Discharge: 2022-12-09 | Disposition: A | Discharge status: Home / Self Care | Payer: 59 | Source: Ambulatory Visit | Attending: Family | Admitting: Family

## 2022-12-09 DIAGNOSIS — M542 Cervicalgia: Secondary | ICD-10-CM

## 2023-01-10 ENCOUNTER — Other Ambulatory Visit: Payer: Self-pay | Admitting: Family

## 2023-01-13 ENCOUNTER — Encounter (INDEPENDENT_AMBULATORY_CARE_PROVIDER_SITE_OTHER): Payer: Self-pay

## 2023-01-26 ENCOUNTER — Ambulatory Visit: Payer: 59 | Admitting: Family

## 2023-01-26 ENCOUNTER — Encounter: Payer: Self-pay | Admitting: Family

## 2023-01-26 VITALS — BP 128/84 | HR 70 | Resp 19 | Ht 69.0 in | Wt 217.4 lb

## 2023-01-26 DIAGNOSIS — E785 Hyperlipidemia, unspecified: Secondary | ICD-10-CM | POA: Diagnosis not present

## 2023-01-26 LAB — LIPID PANEL
Cholesterol: 185 mg/dL (ref 0–200)
HDL: 45.6 mg/dL (ref >39.00)
LDL Cholesterol: 120 mg/dL — ABNORMAL HIGH (ref 0–99)
NonHDL: 139.14
Total CHOL/HDL Ratio: 4
Triglycerides: 94 mg/dL (ref 0.0–149.0)
VLDL: 18.8 mg/dL (ref 0.0–40.0)

## 2023-01-26 LAB — COMPREHENSIVE METABOLIC PANEL
ALT: 8 U/L (ref 0–53)
AST: 14 U/L (ref 0–37)
Albumin: 4.5 g/dL (ref 3.5–5.2)
Alkaline Phosphatase: 47 U/L (ref 39–117)
BUN: 19 mg/dL (ref 6–23)
CO2: 30 mEq/L (ref 19–32)
Calcium: 9.6 mg/dL (ref 8.4–10.5)
Chloride: 101 mEq/L (ref 96–112)
Creatinine, Ser: 1.07 mg/dL (ref 0.40–1.50)
GFR: 73.23 mL/min (ref >60.00)
Glucose, Bld: 93 mg/dL (ref 70–99)
Potassium: 3.8 mEq/L (ref 3.5–5.1)
Sodium: 137 mEq/L (ref 135–145)
Total Bilirubin: 0.8 mg/dL (ref 0.2–1.2)
Total Protein: 6.7 g/dL (ref 6.0–8.3)

## 2023-01-26 MED ORDER — FLUTICASONE PROPIONATE 50 MCG/ACT NA SUSP: 2.0000 | Freq: Every day | NASAL | 6 refills | Status: DC

## 2023-01-26 NOTE — Progress Notes (Signed)
John Madden is a 67 y.o. 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     carbidopa-levodopa (SINEMET IR) 25-100 MG tablet Take 1-2 tablets by mouth in the morning and at bedtime. 2 tabs qam, 1 tab qpm     fluticasone (FLONASE) 50 MCG/ACT nasal spray Place 2 sprays into both nostrils daily. 16 g 6   ketoconazole (NIZORAL) 2 % shampoo Apply 1 application topically 2 (two) times a week. 120 mL 6   Multiple Vitamin (MULTIVITAMIN) tablet Take 1 tablet by mouth daily.     tadalafil (CIALIS) 5 MG tablet TAKE 1 TABLET(5 MG) BY MOUTH DAILY AS NEEDED FOR ERECTILE DYSFUNCTION 90 tablet 3   tamsulosin (FLOMAX) 0.4 MG CAPS capsule TAKE 2 CAPSULES(0.8 MG) BY MOUTH DAILY AFTER SUPPER 180 capsule 1   No current facility-administered medications for this visit.    Allergies: Patient has no known allergies.  Past Medical History:  Diagnosis Date   Anxiety    Back pain, chronic    Bursitis    left shoulder   Hyperlipidemia    Parkinson disease     Past Surgical History:  Procedure Laterality Date   ankle surgery     left ankle   APPENDECTOMY     HAMMER TOE SURGERY Left 12/11/2021   Procedure: 2nd and 3rd HAMMER TOE CORRECTION;  Surgeon: Toni Arthurs, MD;  Location: [facility];  Service: Orthopedics;  Laterality: Left;  regional block  75   HERNIA REPAIR     WEIL OSTEOTOMY Left 12/11/2021   Procedure: 2nd and 3rd WEIL OSTEOTOMY;  Surgeon: Toni Arthurs, MD;  Location: [facility];   Service: Orthopedics;  Laterality: Left;  regional block 75    Family History  Problem Relation Age of Onset   Lymphoma Mother        2016 remission   Lung cancer Father     Social History   Tobacco Use   Smoking status: Never   Smokeless tobacco: Never  Substance Use Topics   Alcohol use: Yes    Comment: moderate    Subjective:   Patient presents for follow up lipids; has been hesitant to start statins; would like to review numbers before making decision;   Objective:  Vitals:   01/26/23 0757  BP: 128/84  Pulse: 70  Resp: 19  SpO2: 99%  Weight: 217 lb 6.4 oz (98.6 kg)  Height: 5\' 9"  (1.753 m)    General: Well developed, well nourished, in no acute distress  Skin : Warm and dry.  Head: Normocephalic and atraumatic  Lungs: Respirations unlabored;  Neurologic: Alert and oriented; speech intact; face symmetrical; moves all extremities well; CNII-XII intact without focal deficit  Assessment:  1. Hyperlipidemia, unspecified hyperlipidemia type     Plan:  Update fasting labs; follow up to be determined;   No follow-ups on file.  Orders Placed This Encounter  Procedures   Comp Met (CMET)   Lipid panel    Requested Prescriptions   Signed  Prescriptions Disp Refills   fluticasone (FLONASE) 50 MCG/ACT nasal spray 16 g 6    Sig: Place 2 sprays into both nostrils daily.

## 2023-01-27 ENCOUNTER — Encounter: Payer: Self-pay | Admitting: Family

## 2023-01-29 ENCOUNTER — Other Ambulatory Visit: Payer: Self-pay | Admitting: Family

## 2023-01-29 DIAGNOSIS — E785 Hyperlipidemia, unspecified: Secondary | ICD-10-CM

## 2023-01-29 MED ORDER — ROSUVASTATIN CALCIUM 10 MG PO TABS: 10.0000 mg | ORAL_TABLET | Freq: Every day | ORAL | 3 refills | Status: DC

## 2023-02-24 ENCOUNTER — Encounter: Payer: Self-pay | Admitting: Family

## 2023-02-25 ENCOUNTER — Other Ambulatory Visit: Payer: Self-pay | Admitting: Family

## 2023-02-25 DIAGNOSIS — H6990 Unspecified Eustachian tube disorder, unspecified ear: Secondary | ICD-10-CM

## 2023-03-25 ENCOUNTER — Encounter: Payer: Self-pay | Admitting: Family

## 2023-03-30 ENCOUNTER — Other Ambulatory Visit (INDEPENDENT_AMBULATORY_CARE_PROVIDER_SITE_OTHER): Payer: 59

## 2023-03-30 DIAGNOSIS — E785 Hyperlipidemia, unspecified: Secondary | ICD-10-CM | POA: Diagnosis not present

## 2023-03-30 LAB — LIPID PANEL
Cholesterol: 110 mg/dL (ref 0–200)
HDL: 42.2 mg/dL (ref >39.00)
LDL Cholesterol: 52 mg/dL (ref 0–99)
NonHDL: 68.24
Total CHOL/HDL Ratio: 3
Triglycerides: 80 mg/dL (ref 0.0–149.0)
VLDL: 16 mg/dL (ref 0.0–40.0)

## 2023-03-30 LAB — COMPREHENSIVE METABOLIC PANEL
ALT: 6 U/L (ref 0–53)
AST: 13 U/L (ref 0–37)
Albumin: 4.4 g/dL (ref 3.5–5.2)
Alkaline Phosphatase: 50 U/L (ref 39–117)
BUN: 23 mg/dL (ref 6–23)
CO2: 30 meq/L (ref 19–32)
Calcium: 9.5 mg/dL (ref 8.4–10.5)
Chloride: 103 meq/L (ref 96–112)
Creatinine, Ser: 1.04 mg/dL (ref 0.40–1.50)
GFR: 75.68 mL/min (ref >60.00)
Glucose, Bld: 107 mg/dL — ABNORMAL HIGH (ref 70–99)
Potassium: 3.7 meq/L (ref 3.5–5.1)
Sodium: 140 meq/L (ref 135–145)
Total Bilirubin: 0.9 mg/dL (ref 0.2–1.2)
Total Protein: 6.8 g/dL (ref 6.0–8.3)

## 2023-04-08 ENCOUNTER — Encounter (INDEPENDENT_AMBULATORY_CARE_PROVIDER_SITE_OTHER): Payer: Self-pay | Admitting: Otolaryngology

## 2023-05-17 ENCOUNTER — Encounter: Payer: Self-pay | Admitting: Family

## 2023-05-25 ENCOUNTER — Ambulatory Visit (INDEPENDENT_AMBULATORY_CARE_PROVIDER_SITE_OTHER): Payer: 59 | Admitting: Family

## 2023-05-25 ENCOUNTER — Encounter: Payer: Self-pay | Admitting: Family

## 2023-05-25 VITALS — BP 126/72 | HR 88 | Resp 17 | Ht 69.0 in | Wt 222.6 lb

## 2023-05-25 DIAGNOSIS — L739 Follicular disorder, unspecified: Secondary | ICD-10-CM

## 2023-05-25 MED ORDER — MUPIROCIN 2 % EX OINT: 1.0000 | TOPICAL_OINTMENT | Freq: Two times a day (BID) | CUTANEOUS | 0 refills | Status: DC

## 2023-05-25 NOTE — Progress Notes (Signed)
John Madden is a 67 y.o. 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     carbidopa-levodopa (SINEMET IR) 25-100 MG tablet Take 1-2 tablets by mouth in the morning and at bedtime. 2 tabs qam, 1 tab qpm     ketoconazole (NIZORAL) 2 % shampoo Apply 1 application topically 2 (two) times a week. 120 mL 6   Multiple Vitamin (MULTIVITAMIN) tablet Take 1 tablet by mouth daily.     mupirocin ointment (BACTROBAN) 2 % Apply 1 Application topically 2 (two) times daily. 22 g 0   rosuvastatin (CRESTOR) 10 MG tablet Take 1 tablet (10 mg total) by mouth daily. 90 tablet 3   tadalafil (CIALIS) 5 MG tablet TAKE 1 TABLET(5 MG) BY MOUTH DAILY AS NEEDED FOR ERECTILE DYSFUNCTION 90 tablet 3   tamsulosin (FLOMAX) 0.4 MG CAPS capsule TAKE 2 CAPSULES(0.8 MG) BY MOUTH DAILY AFTER SUPPER 180 capsule 1   fluticasone (FLONASE) 50 MCG/ACT nasal spray Place 2 sprays into both nostrils daily. (Patient not taking: Reported on 05/25/2023) 16 g 6   No current facility-administered medications for this visit.    Allergies: Patient has no known allergies.  Past Medical History:  Diagnosis Date   Anxiety    Back pain, chronic    Bursitis    left shoulder   Hyperlipidemia    Parkinson disease (HCC)     Past Surgical History:  Procedure Laterality Date   ankle surgery     left ankle   APPENDECTOMY     HAMMER TOE SURGERY Left 12/11/2021   Procedure: 2nd and 3rd HAMMER TOE CORRECTION;  Surgeon: Toni Arthurs, MD;  Location: [facility];  Service: Orthopedics;  Laterality: Left;  regional block  75   HERNIA REPAIR     WEIL OSTEOTOMY Left 12/11/2021   Procedure: 2nd and 3rd WEIL OSTEOTOMY;  Surgeon: Toni Arthurs, MD;  Location: [facility];  Service: Orthopedics;  Laterality: Left;  regional block 75    Family History  Problem Relation Age of Onset   Lymphoma Mother        2016 remission   Lung cancer Father     Social History   Tobacco Use   Smoking status: Never   Smokeless tobacco: Never  Substance Use Topics   Alcohol use: Yes    Comment: moderate    Subjective:   "Ingrown hair" x 2 weeks; has been using warm compresses/ Neosporin with good relief; no pain;   Objective:  Vitals:   05/25/23 0813  BP: 126/72  Pulse: 88  Resp: 17  SpO2: 95%  Weight: 222 lb 9.6 oz (101 kg)  Height: 5\' 9"  (1.753 m)    General: Well developed, well nourished, in no acute distress  Skin : Warm and dry. Resolving folliculitis;  Head: Normocephalic and atraumatic  Lungs: Respirations unlabored;  Neurologic: Alert and oriented; speech intact; face symmetrical; moves all extremities well; CNII-XII intact without focal deficit   Assessment:  1. Folliculitis  Plan:  Essentially resolved; encouraged patient to use anti-bacterial soap; Rx for Bactroban- use bid prn; follow up worse, no better.   No follow-ups on file.  No orders of the defined types were placed in this encounter.   Requested Prescriptions   Signed Prescriptions Disp Refills   mupirocin ointment (BACTROBAN) 2 % 22 g 0    Sig: Apply 1 Application topically 2 (two) times daily.

## 2023-07-02 ENCOUNTER — Other Ambulatory Visit: Payer: Self-pay | Admitting: Family

## 2023-07-18 ENCOUNTER — Other Ambulatory Visit: Payer: Self-pay | Admitting: Family

## 2023-07-19 ENCOUNTER — Encounter: Payer: Self-pay | Admitting: Family

## 2023-10-08 ENCOUNTER — Other Ambulatory Visit: Payer: Self-pay | Admitting: Family

## 2023-10-08 ENCOUNTER — Other Ambulatory Visit

## 2023-10-08 DIAGNOSIS — E785 Hyperlipidemia, unspecified: Secondary | ICD-10-CM

## 2023-10-08 DIAGNOSIS — Z125 Encounter for screening for malignant neoplasm of prostate: Secondary | ICD-10-CM

## 2023-10-08 DIAGNOSIS — R7309 Other abnormal glucose: Secondary | ICD-10-CM

## 2023-10-08 LAB — PSA: PSA: 1.01 ng/mL (ref 0.10–4.00)

## 2023-10-08 LAB — LIPID PANEL
Cholesterol: 102 mg/dL (ref 0–200)
HDL: 50.3 mg/dL (ref >39.00)
LDL Cholesterol: 41 mg/dL (ref 0–99)
NonHDL: 51.66
Total CHOL/HDL Ratio: 2
Triglycerides: 53 mg/dL (ref 0.0–149.0)
VLDL: 10.6 mg/dL (ref 0.0–40.0)

## 2023-10-08 LAB — COMPREHENSIVE METABOLIC PANEL WITH GFR
ALT: 6 U/L (ref 0–53)
AST: 19 U/L (ref 0–37)
Albumin: 4.5 g/dL (ref 3.5–5.2)
Alkaline Phosphatase: 45 U/L (ref 39–117)
BUN: 20 mg/dL (ref 6–23)
CO2: 27 meq/L (ref 19–32)
Calcium: 9.5 mg/dL (ref 8.4–10.5)
Chloride: 105 meq/L (ref 96–112)
Creatinine, Ser: 1.1 mg/dL (ref 0.40–1.50)
GFR: 70.49 mL/min (ref >60.00)
Glucose, Bld: 99 mg/dL (ref 70–99)
Potassium: 4 meq/L (ref 3.5–5.1)
Sodium: 142 meq/L (ref 135–145)
Total Bilirubin: 1.1 mg/dL (ref 0.2–1.2)
Total Protein: 6.5 g/dL (ref 6.0–8.3)

## 2023-10-08 LAB — CBC WITH DIFFERENTIAL/PLATELET
Basophils Absolute: 0 10*3/uL (ref 0.0–0.1)
Basophils Relative: 0.6 % (ref 0.0–3.0)
Eosinophils Absolute: 0.2 10*3/uL (ref 0.0–0.7)
Eosinophils Relative: 3.2 % (ref 0.0–5.0)
HCT: 48.9 % (ref 39.0–52.0)
Hemoglobin: 16.3 g/dL (ref 13.0–17.0)
Lymphocytes Relative: 25.5 % (ref 12.0–46.0)
Lymphs Abs: 1.6 10*3/uL (ref 0.7–4.0)
MCHC: 33.3 g/dL (ref 30.0–36.0)
MCV: 94.6 fl (ref 78.0–100.0)
Monocytes Absolute: 0.5 10*3/uL (ref 0.1–1.0)
Monocytes Relative: 7.9 % (ref 3.0–12.0)
Neutro Abs: 3.9 10*3/uL (ref 1.4–7.7)
Neutrophils Relative %: 62.8 % (ref 43.0–77.0)
Platelets: 136 10*3/uL — ABNORMAL LOW (ref 150.0–400.0)
RBC: 5.17 Mil/uL (ref 4.22–5.81)
RDW: 13.3 % (ref 11.5–15.5)
WBC: 6.2 10*3/uL (ref 4.0–10.5)

## 2023-10-08 LAB — HEMOGLOBIN A1C: Hgb A1c MFr Bld: 5.2 % (ref 4.6–6.5)

## 2023-10-15 ENCOUNTER — Ambulatory Visit (INDEPENDENT_AMBULATORY_CARE_PROVIDER_SITE_OTHER): Admitting: Family

## 2023-10-15 VITALS — BP 122/80 | HR 83 | Ht 69.0 in | Wt 225.0 lb

## 2023-10-15 DIAGNOSIS — Z0000 Encounter for general adult medical examination without abnormal findings: Secondary | ICD-10-CM

## 2023-10-15 DIAGNOSIS — Z23 Encounter for immunization: Secondary | ICD-10-CM

## 2023-10-15 MED ORDER — ROSUVASTATIN CALCIUM 5 MG PO TABS
5.0000 mg | ORAL_TABLET | Freq: Every day | ORAL | 3 refills | Status: AC
Start: 2023-10-15 — End: ?

## 2023-10-15 NOTE — Progress Notes (Signed)
 John Madden is a 68 y.o. 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     carbidopa-levodopa (SINEMET IR) 25-100 MG tablet Take 1-2 tablets by mouth in the morning and at bedtime. 2 tabs qam, 1 tab qpm     Multiple Vitamin (MULTIVITAMIN) tablet Take 1 tablet by mouth daily.     tadalafil  (CIALIS ) 5 MG tablet TAKE 1 TABLET(5 MG) BY MOUTH DAILY AS NEEDED FOR ERECTILE DYSFUNCTION 90 tablet 3   tamsulosin  (FLOMAX ) 0.4 MG CAPS capsule TAKE 2 CAPSULES(0.8 MG) BY MOUTH DAILY AFTER SUPPER 180 capsule 1   rosuvastatin  (CRESTOR ) 5 MG tablet Take 1 tablet (5 mg total) by mouth daily. 90 tablet 3   No current facility-administered medications for this visit.    Allergies: Patient has no known allergies.  Past Medical History:  Diagnosis Date   Anxiety    Back pain, chronic    Bursitis    left shoulder   Hyperlipidemia    Parkinson disease (HCC)     Past Surgical History:  Procedure Laterality Date   ankle surgery     left ankle   APPENDECTOMY     HAMMER TOE SURGERY Left 12/11/2021   Procedure: 2nd and 3rd HAMMER TOE CORRECTION;  Surgeon: Amada Backer, MD;  Location: [facility];  Service: Orthopedics;  Laterality: Left;  regional block  75   HERNIA REPAIR     WEIL OSTEOTOMY Left 12/11/2021   Procedure: 2nd and 3rd WEIL OSTEOTOMY;  Surgeon: Amada Backer, MD;  Location: [facility];  Service: Orthopedics;  Laterality: Left;  regional block 75    Family History  Problem  Relation Age of Onset   Lymphoma Mother        2016 remission   Lung cancer Father     Social History   Tobacco Use   Smoking status: Never   Smokeless tobacco: Never  Substance Use Topics   Alcohol use: Yes    Comment: moderate    Subjective:   Presents for yearly CPE; labs were done prior; continuing to work with neurology for regular follow up on Parkinson's; no acute concerns today;   Review of Systems  Constitutional: Negative.   HENT: Negative.    Eyes: Negative.   Respiratory: Negative.    Cardiovascular: Negative.   Gastrointestinal: Negative.   Genitourinary: Negative.   Musculoskeletal: Negative.   Skin: Negative.   Neurological: Negative.   Endo/Heme/Allergies: Negative.   Psychiatric/Behavioral: Negative.       Objective:  Vitals:   10/15/23 1301  BP: 122/80  Pulse: 83  SpO2: 98%  Weight: 225 lb (102.1 kg)  Height: 5\' 9"  (1.753 m)    General: Well developed, well nourished, in no acute distress  Skin : Warm and dry.  Head: Normocephalic and atraumatic  Eyes: Sclera and conjunctiva clear; pupils round and reactive to light; extraocular movements intact  Ears: External normal; canals clear; tympanic membranes normal  Oropharynx: Pink, supple. No suspicious lesions  Neck: Supple without  thyromegaly, adenopathy  Lungs: Respirations unlabored; clear to auscultation bilaterally without wheeze, rales, rhonchi  CVS exam: normal rate and regular rhythm.  Abdomen: Soft; nontender; nondistended; normoactive bowel sounds; no masses or hepatosplenomegaly  Musculoskeletal: No deformities; no active joint inflammation  Extremities: No edema, cyanosis, clubbing  Vessels: Symmetric bilaterally  Neurologic: Alert and oriented; speech intact; face symmetrical; moves all extremities well; CNII-XII intact without focal deficit   Assessment:  1. PE (physical exam), annual     Plan:  Reviewed labs with patient that were done earlier- patient's LDL is lower than goal  so will cut dosage back to Rosuvastatin  5 mg daily; continue with neurology;  Age appropriate preventive healthcare needs addressed; encouraged regular eye doctor and dental exams; encouraged regular exercise; will update labs and refills as needed today; follow-up in 1 year, sooner prn.    No follow-ups on file.  No orders of the defined types were placed in this encounter.   Requested Prescriptions   Signed Prescriptions Disp Refills   rosuvastatin  (CRESTOR ) 5 MG tablet 90 tablet 3    Sig: Take 1 tablet (5 mg total) by mouth daily.

## 2023-10-15 NOTE — Addendum Note (Signed)
 Addended by: Orean Giarratano M on: 10/15/2023 01:53 PM   Modules accepted: Orders

## 2023-10-17 ENCOUNTER — Encounter: Payer: Self-pay | Admitting: Family

## 2023-12-29 ENCOUNTER — Other Ambulatory Visit: Payer: Self-pay | Admitting: Family

## 2024-06-01 ENCOUNTER — Encounter: Payer: Self-pay | Admitting: Family Medicine

## 2024-06-01 ENCOUNTER — Ambulatory Visit: Payer: Self-pay | Admitting: Family Medicine

## 2024-06-01 ENCOUNTER — Ambulatory Visit: Admitting: Family Medicine

## 2024-06-01 VITALS — BP 120/84 | HR 67 | Temp 97.8°F | Ht 69.0 in | Wt 215.0 lb

## 2024-06-01 DIAGNOSIS — R3912 Poor urinary stream: Secondary | ICD-10-CM | POA: Diagnosis not present

## 2024-06-01 DIAGNOSIS — D696 Thrombocytopenia, unspecified: Secondary | ICD-10-CM

## 2024-06-01 DIAGNOSIS — G20A1 Parkinson's disease without dyskinesia, without mention of fluctuations: Secondary | ICD-10-CM | POA: Diagnosis not present

## 2024-06-01 DIAGNOSIS — N401 Enlarged prostate with lower urinary tract symptoms: Secondary | ICD-10-CM

## 2024-06-01 DIAGNOSIS — Z7689 Persons encountering health services in other specified circumstances: Secondary | ICD-10-CM

## 2024-06-01 DIAGNOSIS — Z860100 Personal history of colon polyps, unspecified: Secondary | ICD-10-CM | POA: Diagnosis not present

## 2024-06-01 DIAGNOSIS — K5904 Chronic idiopathic constipation: Secondary | ICD-10-CM | POA: Diagnosis not present

## 2024-06-01 DIAGNOSIS — Z1211 Encounter for screening for malignant neoplasm of colon: Secondary | ICD-10-CM

## 2024-06-01 LAB — CBC WITH DIFFERENTIAL/PLATELET
Basophils Absolute: 0 K/uL (ref 0.0–0.1)
Basophils Relative: 0.2 % (ref 0.0–3.0)
Eosinophils Absolute: 0.2 K/uL (ref 0.0–0.7)
Eosinophils Relative: 2.5 % (ref 0.0–5.0)
HCT: 46.4 % (ref 39.0–52.0)
Hemoglobin: 15.8 g/dL (ref 13.0–17.0)
Lymphocytes Relative: 25.5 % (ref 12.0–46.0)
Lymphs Abs: 1.6 K/uL (ref 0.7–4.0)
MCHC: 34.1 g/dL (ref 30.0–36.0)
MCV: 93.5 fl (ref 78.0–100.0)
Monocytes Absolute: 0.5 K/uL (ref 0.1–1.0)
Monocytes Relative: 7.3 % (ref 3.0–12.0)
Neutro Abs: 4.1 K/uL (ref 1.4–7.7)
Neutrophils Relative %: 64.5 % (ref 43.0–77.0)
Platelets: 147 K/uL — ABNORMAL LOW (ref 150.0–400.0)
RBC: 4.96 Mil/uL (ref 4.22–5.81)
RDW: 13.1 % (ref 11.5–15.5)
WBC: 6.3 K/uL (ref 4.0–10.5)

## 2024-06-01 LAB — COMPREHENSIVE METABOLIC PANEL WITH GFR
ALT: 6 U/L (ref 3–53)
AST: 14 U/L (ref 5–37)
Albumin: 4.7 g/dL (ref 3.5–5.2)
Alkaline Phosphatase: 47 U/L (ref 39–117)
BUN: 22 mg/dL (ref 6–23)
CO2: 28 meq/L (ref 19–32)
Calcium: 9.6 mg/dL (ref 8.4–10.5)
Chloride: 102 meq/L (ref 96–112)
Creatinine, Ser: 0.95 mg/dL (ref 0.40–1.50)
GFR: 83.67 mL/min (ref >60.00)
Glucose, Bld: 102 mg/dL — ABNORMAL HIGH (ref 70–99)
Potassium: 4.2 meq/L (ref 3.5–5.1)
Sodium: 138 meq/L (ref 135–145)
Total Bilirubin: 0.8 mg/dL (ref 0.2–1.2)
Total Protein: 7 g/dL (ref 6.0–8.3)

## 2024-06-01 LAB — FERRITIN: Ferritin: 133.1 ng/mL (ref 22.0–322.0)

## 2024-06-01 LAB — TSH: TSH: 0.66 u[IU]/mL (ref 0.35–5.50)

## 2024-06-01 LAB — FOLATE: Folate: 18.2 ng/mL (ref >5.9)

## 2024-06-01 LAB — VITAMIN B12: Vitamin B-12: 611 pg/mL (ref 211–911)

## 2024-06-01 NOTE — Patient Instructions (Addendum)
 Please go downstairs for labs before you leave.    Please call [facility] [phone] to clarify about your colonoscopy    You will receive a call to schedule an appointment with [facility]   Try Miralax 1 capful daily and stay hydrated to help with constipation. Eat a diet high in fiber as well.

## 2024-06-01 NOTE — Progress Notes (Signed)
 New Patient Office Visit  Subjective    Patient ID: John Madden, male    DOB: [birth year]  Age: 68 y.o. MRN: [id number]  CC:  Chief Complaint  Patient presents with   Establish Care    Aches and pains    Discussed the use of AI scribe software for clinical note transcription with the patient, who gave verbal consent to proceed.  History of Present Illness [name] is a 68 year old male with Parkinson's disease who presents to establish care.  Parkinson's disease - Followed by a movement disorder clinic - Takes Sinemet three times daily spaced five hours apart - Takes Amantadine two in the morning and one at noon - Uses melatonin as needed for sleep  Constipation - Chronic constipation related to Parkinson's disease - Uses Dulcolax a couple of times a week - Maintains a high fiber diet  Urinary symptoms - Urinary symptoms managed with Flomax  - No urology evaluation in four to five years  Hyperlipidemia - Treated with rosuvastatin  every other day - Good LDL reduction  Colorectal cancer screening - Due for colonoscopy in January 2028 after prior benign polyps - Colorectal cancer screening performed every five years  Abdominal pain - Intermittent sharp side pain that comes and goes     Outpatient Encounter Medications as of 06/01/2024  Medication Sig   Amantadine HCl 100 MG tablet Take 150 mg by mouth in the morning, at noon, and at bedtime.   carbidopa-levodopa (SINEMET IR) 25-100 MG tablet Take 1-2 tablets by mouth in the morning and at bedtime. 2 tabs qam, 1 tab qpm   Multiple Vitamin (MULTIVITAMIN) tablet Take 1 tablet by mouth daily.   rosuvastatin  (CRESTOR ) 5 MG tablet Take 1 tablet (5 mg total) by mouth daily.   tadalafil  (CIALIS ) 5 MG tablet TAKE 1 TABLET(5 MG) BY MOUTH DAILY AS NEEDED FOR ERECTILE DYSFUNCTION   tamsulosin  (FLOMAX ) 0.4 MG CAPS capsule TAKE 2 CAPSULES(0.8 MG) BY MOUTH DAILY AFTER SUPPER   No  facility-administered encounter medications on file as of 06/01/2024.    Past Medical History:  Diagnosis Date   Anxiety    Back pain, chronic    Bursitis    left shoulder   Ear itch 04/08/2023   Hyperlipidemia    Parkinson disease (HCC)     Past Surgical History:  Procedure Laterality Date   ankle surgery     left ankle   APPENDECTOMY     HAMMER TOE SURGERY Left 12/11/2021   Procedure: 2nd and 3rd HAMMER TOE CORRECTION;  Surgeon: Kit Rush, MD;  Location: [facility];  Service: Orthopedics;  Laterality: Left;  regional block  75   HERNIA REPAIR     WEIL OSTEOTOMY Left 12/11/2021   Procedure: 2nd and 3rd WEIL OSTEOTOMY;  Surgeon: Kit Rush, MD;  Location: [facility];  Service: Orthopedics;  Laterality: Left;  regional block 75    Family History  Problem Relation Age of Onset   Lymphoma Mother        2016 remission   Lung cancer Father     Social History   Socioeconomic History   Marital status: Single    Spouse name: Not on file   Number of children: Not on file   Years of education: Not on file   Highest education level: 12th grade  Occupational History   Not on file  Tobacco Use   Smoking status: Never   Smokeless tobacco: Never  Substance and Sexual  Activity   Alcohol use: Yes    Comment: moderate   Drug use: No   Sexual activity: Yes  Other Topics Concern   Not on file  Social History Narrative   Lives with sig other  One grown child lives with mother in [place] .  Works at [employer].  HS grad.  Caffeine one cup daily.    Social Drivers of Health   Tobacco Use: Low Risk (06/01/2024)   Patient History    Smoking Tobacco Use: Never    Smokeless Tobacco Use: Never    Passive Exposure: Not on file  Financial Resource Strain: Low Risk (05/31/2024)   Overall Financial Resource Strain (CARDIA)    Difficulty of Paying Living Expenses: Not hard at all  Food Insecurity: No Food Insecurity (05/31/2024)   Epic    Worried  About Programme Researcher, Broadcasting/film/video in the Last Year: Never true    Ran Out of Food in the Last Year: Never true  Transportation Needs: No Transportation Needs (05/31/2024)   Epic    Lack of Transportation (Medical): No    Lack of Transportation (Non-Medical): No  Physical Activity: Inactive (05/31/2024)   Exercise Vital Sign    Days of Exercise per Week: 0 days    Minutes of Exercise per Session: Not on file  Stress: No Stress Concern Present (05/31/2024)   [employer] of Occupational Health - Occupational Stress Questionnaire    Feeling of Stress: Only a little  Social Connections: Socially Isolated (05/31/2024)   Social Connection and Isolation Panel    Frequency of Communication with Friends and Family: Once a week    Frequency of Social Gatherings with Friends and Family: Once a week    Attends Religious Services: Never    Database Administrator or Organizations: No    Attends Engineer, Structural: Not on file    Marital Status: Living with partner  Intimate Partner Violence: Not on file  Depression (PHQ2-9): Low Risk (10/15/2023)   Depression (PHQ2-9)    PHQ-2 Score: 0  Alcohol Screen: Low Risk (05/31/2024)   Alcohol Screen    Last Alcohol Screening Score (AUDIT): 1  Housing: Low Risk (05/31/2024)   Epic    Unable to Pay for Housing in the Last Year: No    Number of Times Moved in the Last Year: 0    Homeless in the Last Year: No  Utilities: Low Risk (04/08/2023)   Received from [facility]   Utilities    In the past 12 months has the electric, gas, oil, or water company threatened to shut off services in your home? : No  Health Literacy: Not on file    Review of Systems  Constitutional:  Negative for chills and fever.  Respiratory:  Negative for shortness of breath.   Cardiovascular:  Negative for chest pain and palpitations.  Gastrointestinal:  Positive for constipation. Negative for abdominal pain, blood in stool, diarrhea, nausea and vomiting.   Genitourinary:  Negative for dysuria, frequency and urgency.       Hesitancy and weak stream- chronic   Musculoskeletal:  Positive for joint pain and myalgias.  Neurological:  Negative for dizziness, focal weakness and headaches.        Objective    BP 120/84   Pulse 67   Temp 97.8 F (36.6 C) (Temporal)   Ht 5' 9 (1.753 m)   Wt 215 lb (97.5 kg)   SpO2 97%   BMI 31.75 kg/m   Physical Exam Constitutional:  General: He is not in acute distress.    Appearance: He is not ill-appearing.  HENT:     Mouth/Throat:     Mouth: Mucous membranes are moist.     Pharynx: Oropharynx is clear.  Eyes:     Extraocular Movements: Extraocular movements intact.     Conjunctiva/sclera: Conjunctivae normal.     Pupils: Pupils are equal, round, and reactive to light.  Cardiovascular:     Rate and Rhythm: Normal rate and regular rhythm.  Pulmonary:     Effort: Pulmonary effort is normal.     Breath sounds: Normal breath sounds.  Musculoskeletal:        General: Normal range of motion.     Cervical back: Normal range of motion and neck supple.  Skin:    General: Skin is warm and dry.  Neurological:     General: No focal deficit present.     Mental Status: He is alert and oriented to person, place, and time.     Motor: No weakness.     Coordination: Coordination normal.     Gait: Gait normal.  Psychiatric:        Mood and Affect: Mood normal.        Behavior: Behavior normal.        Thought Content: Thought content normal.         Assessment & Plan:   Problem List Items Addressed This Visit   None Visit Diagnoses       Thrombocytopenia    -  Primary   Relevant Orders   CBC with Differential/Platelet (Completed)   Ferritin (Completed)   Folate (Completed)   Vitamin B12 (Completed)     Parkinson's disease without dyskinesia, unspecified whether manifestations fluctuate (HCC)         Encounter to establish care         History of colon polyps         Screen for  colon cancer         Weak urine stream       Relevant Orders   Ambulatory referral to Urology     Benign prostatic hyperplasia with weak urinary stream       Relevant Orders   Ambulatory referral to Urology     Chronic idiopathic constipation       Relevant Orders   CBC with Differential/Platelet (Completed)   Comprehensive metabolic panel with GFR (Completed)   TSH (Completed)       Assessment and Plan Assessment & Plan Parkinson's disease Managed by [facility]. Current medications include Sinemet (carbidopa/levodopa) and amantadine. Sinemet is taken three times daily, and amantadine is taken twice in the morning and once at noon. - Continue Sinemet three times daily. - Continue amantadine twice in the morning and once at noon. - Follow up with [facility] in four months.  Benign prostatic hyperplasia with lower urinary tract symptoms Benign prostatic hyperplasia with lower urinary tract symptoms. Currently on Flomax  for urinary symptoms. No recent urologist evaluation since moving to [place]. Previous urologist in [place] went out of business. - Referred to [facility] for evaluation of urinary symptoms.  Chronic idiopathic constipation Chronic idiopathic constipation, possibly related to Parkinson's disease. Currently using Dulcolax a couple of times a week. Advised to switch to Miralax for more consistent management. - Discontinued Dulcolax. - Started Miralax daily, with the option to reduce to half a capful if stools become too loose.  Thrombocytopenia Mild thrombocytopenia with platelet  counts in the 130 range. No immediate concerns but requires monitoring. - Ordered blood work to check platelet levels.  Hyperlipidemia Managed with rosuvastatin . LDL levels have improved significantly, with the last recorded LDL at 41.  - Continue rosuvastatin  every other day.  General Health Maintenance Colonoscopy is  due in 2028. Previous colonoscopy showed non-cancerous polyps. No immediate need for repeat colonoscopy. Flu shot not yet received, plans to wait for girlfriend's MS treatment schedule. - Contact [facility] to confirm colonoscopy schedule.     Return in about 3 months (around 08/30/2024) for fasting CPE.   Boby Mackintosh, NP-C

## 2024-07-11 ENCOUNTER — Encounter: Payer: Self-pay | Admitting: Gastroenterology

## 2024-07-11 ENCOUNTER — Ambulatory Visit: Admitting: Gastroenterology

## 2024-07-11 VITALS — BP 160/90 | HR 85 | Ht 69.0 in | Wt 213.0 lb

## 2024-07-11 DIAGNOSIS — Z800 Family history of malignant neoplasm of digestive organs: Secondary | ICD-10-CM | POA: Diagnosis not present

## 2024-07-11 DIAGNOSIS — Z860100 Personal history of colon polyps, unspecified: Secondary | ICD-10-CM

## 2024-07-11 DIAGNOSIS — Z860101 Personal history of adenomatous and serrated colon polyps: Secondary | ICD-10-CM | POA: Diagnosis not present

## 2024-07-11 DIAGNOSIS — K5900 Constipation, unspecified: Secondary | ICD-10-CM

## 2024-07-11 DIAGNOSIS — K5909 Other constipation: Secondary | ICD-10-CM

## 2024-07-11 DIAGNOSIS — K649 Unspecified hemorrhoids: Secondary | ICD-10-CM

## 2024-07-11 NOTE — Progress Notes (Signed)
 Agree with the assessment and plan as outlined by Boone Master, PA-C.

## 2024-07-11 NOTE — Progress Notes (Signed)
 "  Chief Complaint: Surveillance colonoscopy Primary GI MD: Dr. Stacia  HPI: Discussed the use of AI scribe software for clinical note transcription with the patient, who gave verbal consent to proceed.  History of Present Illness   John Madden is a 69 year old male with chronic constipation, hemorrhoids, and prior precancerous colon polyp who presents for colorectal cancer screening and post-polypectomy surveillance.  He presents after receiving a call to schedule a follow-up colonoscopy per his previous GI in [place]. Last colonoscopy in 2021 revealed a very small precancerous polyp. Family history is notable for his mother being diagnosed with colon cancer after age 82.  He saw Dr. Stacia in 2023 who recommended colonoscopy in 2028 at that time  Denies current weight loss, change in bowel habits, rectal bleeding, nausea, vomiting, or reflux. Manages chronic constipation with Miralax, typically taking one capful every other day. Constipation is currently under control, though missed doses result in harder and larger stools. Consistent Miralax use improves symptoms. Retired and works from home, allowing flexibility in public house manager.  History of hemorrhoids, previously associated with intermittent rectal bleeding, particularly with hard and large stools. No current hemorrhoidal symptoms or rectal bleeding.  Reports an eight-pound weight loss over the past couple of months, attributed to intentional dietary changes. No unintentional weight loss or changes in appetite. Has Parkinson's disease and has been advised to exercise.   PREVIOUS GI WORKUP   The patient was previously followed by Dr. Evalene Fanti of [facility]. Colonoscopy 06-30-2019:  2mm sigmoid TA, left sided diverticulosis Colonoscopy 06-01-2014:  Left sided diverticulosis Colonoscopy 05-17-2009:  Rectal Hps, left sided diverticulosis EGD 06-01-2014:  Normal, gastric bx neg H.  pylori  Past Medical History:  Diagnosis Date   Anxiety    Back pain, chronic    Bursitis    left shoulder   Ear itch 04/08/2023   Hyperlipidemia    Parkinson disease (HCC)     Past Surgical History:  Procedure Laterality Date   ankle surgery     left ankle   APPENDECTOMY     HAMMER TOE SURGERY Left 12/11/2021   Procedure: 2nd and 3rd HAMMER TOE CORRECTION;  Surgeon: Kit Rush, MD;  Location: [facility];  Service: Orthopedics;  Laterality: Left;  regional block  75   HERNIA REPAIR     WEIL OSTEOTOMY Left 12/11/2021   Procedure: 2nd and 3rd WEIL OSTEOTOMY;  Surgeon: Kit Rush, MD;  Location: [facility];  Service: Orthopedics;  Laterality: Left;  regional block 75    Current Outpatient Medications  Medication Sig Dispense Refill   Amantadine HCl 100 MG tablet Take 150 mg by mouth in the morning, at noon, and at bedtime.     carbidopa-levodopa (SINEMET IR) 25-100 MG tablet Take 1-2 tablets by mouth in the morning and at bedtime. 2 tabs qam, 1 tab qpm     Multiple Vitamin (MULTIVITAMIN) tablet Take 1 tablet by mouth daily.     rosuvastatin  (CRESTOR ) 5 MG tablet Take 1 tablet (5 mg total) by mouth daily. 90 tablet 3   tadalafil  (CIALIS ) 5 MG tablet TAKE 1 TABLET(5 MG) BY MOUTH DAILY AS NEEDED FOR ERECTILE DYSFUNCTION 90 tablet 3   tamsulosin  (FLOMAX ) 0.4 MG CAPS capsule TAKE 2 CAPSULES(0.8 MG) BY MOUTH DAILY AFTER SUPPER 180 capsule 1   No current facility-administered medications for this visit.    Allergies as of 07/11/2024   (No Known Allergies)    Family History  Problem Relation  Age of Onset   Lymphoma Mother        2016 remission   Lung cancer Father     Social History   Socioeconomic History   Marital status: Single    Spouse name: Not on file   Number of children: Not on file   Years of education: Not on file   Highest education level: 12th grade  Occupational History   Not on file  Tobacco Use   Smoking status: Never    Smokeless tobacco: Never  Substance and Sexual Activity   Alcohol use: Yes    Comment: moderate   Drug use: No   Sexual activity: Yes  Other Topics Concern   Not on file  Social History Narrative   Lives with sig other  One grown child lives with mother in [place] .  Works at [employer].  HS grad.  Caffeine one cup daily.    Social Drivers of Health   Tobacco Use: Low Risk (07/11/2024)   Patient History    Smoking Tobacco Use: Never    Smokeless Tobacco Use: Never    Passive Exposure: Not on file  Financial Resource Strain: Low Risk (05/31/2024)   Overall Financial Resource Strain (CARDIA)    Difficulty of Paying Living Expenses: Not hard at all  Food Insecurity: No Food Insecurity (05/31/2024)   Epic    Worried About Programme Researcher, Broadcasting/film/video in the Last Year: Never true    Ran Out of Food in the Last Year: Never true  Transportation Needs: No Transportation Needs (05/31/2024)   Epic    Lack of Transportation (Medical): No    Lack of Transportation (Non-Medical): No  Physical Activity: Inactive (05/31/2024)   Exercise Vital Sign    Days of Exercise per Week: 0 days    Minutes of Exercise per Session: Not on file  Stress: No Stress Concern Present (05/31/2024)   [employer] of Occupational Health - Occupational Stress Questionnaire    Feeling of Stress: Only a little  Social Connections: Socially Isolated (05/31/2024)   Social Connection and Isolation Panel    Frequency of Communication with Friends and Family: Once a week    Frequency of Social Gatherings with Friends and Family: Once a week    Attends Religious Services: Never    Database Administrator or Organizations: No    Attends Engineer, Structural: Not on file    Marital Status: Living with partner  Intimate Partner Violence: Not on file  Depression (PHQ2-9): Low Risk (10/15/2023)   Depression (PHQ2-9)    PHQ-2 Score: 0  Alcohol Screen: Low Risk (05/31/2024)   Alcohol Screen    Last Alcohol Screening  Score (AUDIT): 1  Housing: Low Risk (05/31/2024)   Epic    Unable to Pay for Housing in the Last Year: No    Number of Times Moved in the Last Year: 0    Homeless in the Last Year: No  Utilities: Low Risk (04/08/2023)   Received from [facility]   Utilities    In the past 12 months has the electric, gas, oil, or water company threatened to shut off services in your home? : No  Health Literacy: Not on file    Review of Systems:    Constitutional: No weight loss, fever, chills, weakness or fatigue HEENT: Eyes: No change in vision               Ears, Nose, Throat:  No change in hearing or congestion Skin: No rash  or itching Cardiovascular: No chest pain, chest pressure or palpitations   Respiratory: No SOB or cough Gastrointestinal: See HPI and otherwise negative Genitourinary: No dysuria or change in urinary frequency Neurological: No headache, dizziness or syncope Musculoskeletal: No new muscle or joint pain Hematologic: No bleeding or bruising Psychiatric: No history of depression or anxiety    Physical Exam:  Vital signs: BP (!) 160/90   Pulse 85   Ht 5' 9 (1.753 m)   Wt 213 lb (96.6 kg) Comment: Per patient, refused to weigh  BMI 31.45 kg/m   Constitutional: NAD, alert and cooperative Head:  Normocephalic and atraumatic. Eyes:   PEERL, EOMI. No icterus. Conjunctiva pink. Respiratory: Respirations even and unlabored. Lungs clear to auscultation bilaterally.   No wheezes, crackles, or rhonchi.  Cardiovascular:  Regular rate and rhythm. No peripheral edema, cyanosis or pallor.  Gastrointestinal:  Soft, nondistended, nontender. No rebound or guarding. Normal bowel sounds. No appreciable masses or hepatomegaly. Rectal:  Declines Msk:  Symmetrical without gross deformities. Without edema, no deformity or joint abnormality.  Neurologic:  Alert and  oriented x4;  grossly normal neurologically.  Skin:   Dry and intact without significant lesions or rashes. Psychiatric:  Oriented to person, place and time. Demonstrates good judgement and reason without abnormal affect or behaviors.  Physical Exam    RELEVANT LABS AND IMAGING: CBC    Component Value Date/Time   WBC 6.3 06/01/2024 1400   RBC 4.96 06/01/2024 1400   HGB 15.8 06/01/2024 1400   HCT 46.4 06/01/2024 1400   PLT 147.0 (L) 06/01/2024 1400   MCV 93.5 06/01/2024 1400   MCH 31.4 12/05/2020 1145   MCHC 34.1 06/01/2024 1400   RDW 13.1 06/01/2024 1400   LYMPHSABS 1.6 06/01/2024 1400   MONOABS 0.5 06/01/2024 1400   EOSABS 0.2 06/01/2024 1400   BASOSABS 0.0 06/01/2024 1400    CMP     Component Value Date/Time   NA 138 06/01/2024 1400   K 4.2 06/01/2024 1400   CL 102 06/01/2024 1400   CO2 28 06/01/2024 1400   GLUCOSE 102 (H) 06/01/2024 1400   BUN 22 06/01/2024 1400   CREATININE 0.95 06/01/2024 1400   CREATININE 1.17 01/01/2020 1045   CALCIUM  9.6 06/01/2024 1400   PROT 7.0 06/01/2024 1400   ALBUMIN 4.7 06/01/2024 1400   AST 14 06/01/2024 1400   ALT 6 06/01/2024 1400   ALKPHOS 47 06/01/2024 1400   BILITOT 0.8 06/01/2024 1400   GFRNONAA >60 12/05/2020 1145     Assessment/Plan:   Colorectal cancer screening, post-polypectomy surveillance Asymptomatic, surveillance colonoscopy due in 2028 per guidelines.  Last colonoscopy 06/2019 with single diminutive tubular adenoma.  Family history of colon cancer in mother over 18 - Due for repeat 06/2026 - Placed in recall system for 2028 colonoscopy. - Provided anticipatory guidance to report new gastrointestinal symptoms, including changes in bowel habits, rectal bleeding, or unintentional weight loss prior to 2028.  Chronic constipation Symptoms controlled with intermittent Miralax; occasional non-adherence leads to harder stools. - Advised to continue Miralax as needed, with option to increase to daily dosing if necessary. - Educated on safety and flexible dosing of Miralax, up to 2-3 caps per day if required. - Recommended strategies to  improve medication adherence, such as keeping Miralax in a visible location. - Provided anticipatory guidance to report any new or worsening symptoms. - Hemorrhoids are not an issue for him at this time   Nestor Blower, [facility] 07/11/2024, 11:47 AM  Cc: Lendia, Vickie  L, NP-C "

## 2024-07-11 NOTE — Patient Instructions (Addendum)
 _______________________________________________________  If your blood pressure at your visit was 140/90 or greater, please contact your primary care physician to follow up on this.  _______________________________________________________  If you are age 67 or older, your body mass index should be between 23-30. Your Body mass index is 31.45 kg/m. If this is out of the aforementioned range listed, please consider follow up with your Primary Care Provider.  If you are age 98 or younger, your body mass index should be between 19-25. Your Body mass index is 31.45 kg/m. If this is out of the aformentioned range listed, please consider follow up with your Primary Care Provider.   ________________________________________________________  The [facility] providers would like to encourage you to use MYCHART to communicate with providers for non-urgent requests or questions.  Due to long hold times on the telephone, sending your provider a message by [facility] may be a faster and more efficient way to get a response.  Please allow 48 business hours for a response.  Please remember that this is for non-urgent requests.  _______________________________________________________  [facility] is using a team-based approach to care.  Your team is made up of your doctor and two to three APPS. Our APPS (Nurse Practitioners and Physician Assistants) work with your physician to ensure care continuity for you. They are fully qualified to address your health concerns and develop a treatment plan. They communicate directly with your gastroenterologist to care for you. Seeing the Advanced Practice Practitioners on your physician's team can help you by facilitating care more promptly, often allowing for earlier appointments, access to diagnostic testing, procedures, and other specialty referrals.   You will need a colonoscopy in January 2028.  We will contact you to schedule this.  Thank you for entrusting me  with your care and choosing [facility].  [place], PA-C

## 2024-08-02 ENCOUNTER — Encounter: Admitting: Family Medicine
